# Patient Record
Sex: Male | Born: 1950 | Race: White | Hispanic: No | Marital: Single | Smoking: Never smoker
Health system: Southern US, Community
[De-identification: ages and names within clinical notes are randomized; demographics above are authoritative.]

## PROBLEM LIST (undated history)

## (undated) DIAGNOSIS — I2510 Atherosclerotic heart disease of native coronary artery without angina pectoris: Secondary | ICD-10-CM

## (undated) DIAGNOSIS — E785 Hyperlipidemia, unspecified: Secondary | ICD-10-CM

## (undated) DIAGNOSIS — I10 Essential (primary) hypertension: Secondary | ICD-10-CM

## (undated) HISTORY — DX: Hyperlipidemia, unspecified: E78.5

## (undated) HISTORY — DX: Atherosclerotic heart disease of native coronary artery without angina pectoris: I25.10

## (undated) HISTORY — DX: Essential (primary) hypertension: I10

## (undated) HISTORY — PX: CORONARY STENT PLACEMENT: SHX1402

---

## 2020-02-27 DIAGNOSIS — H2513 Age-related nuclear cataract, bilateral: Secondary | ICD-10-CM | POA: Diagnosis not present

## 2020-02-27 DIAGNOSIS — H40033 Anatomical narrow angle, bilateral: Secondary | ICD-10-CM | POA: Diagnosis not present

## 2020-03-12 DIAGNOSIS — E7849 Other hyperlipidemia: Secondary | ICD-10-CM | POA: Diagnosis not present

## 2020-03-12 DIAGNOSIS — I10 Essential (primary) hypertension: Secondary | ICD-10-CM | POA: Diagnosis not present

## 2020-03-12 DIAGNOSIS — R079 Chest pain, unspecified: Secondary | ICD-10-CM | POA: Diagnosis not present

## 2020-03-12 DIAGNOSIS — I25118 Atherosclerotic heart disease of native coronary artery with other forms of angina pectoris: Secondary | ICD-10-CM | POA: Diagnosis not present
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
## 2020-03-30 ENCOUNTER — Other Ambulatory Visit: Payer: Self-pay

## 2020-03-30 ENCOUNTER — Encounter: Payer: Self-pay | Admitting: Cardiology

## 2020-03-30 ENCOUNTER — Ambulatory Visit: Payer: Medicare Other | Admitting: Cardiology

## 2020-03-30 VITALS — BP 149/87 | HR 69 | Ht 67.0 in | Wt 217.0 lb

## 2020-03-30 DIAGNOSIS — I2510 Atherosclerotic heart disease of native coronary artery without angina pectoris: Secondary | ICD-10-CM

## 2020-03-30 DIAGNOSIS — E782 Mixed hyperlipidemia: Secondary | ICD-10-CM | POA: Diagnosis not present

## 2020-03-30 DIAGNOSIS — I10 Essential (primary) hypertension: Secondary | ICD-10-CM | POA: Diagnosis not present

## 2020-03-30 MED ORDER — ASPIRIN EC 81 MG PO TBEC: 81.0000 mg | DELAYED_RELEASE_TABLET | Freq: Every day | ORAL | 3 refills | Status: DC

## 2020-03-30 MED ORDER — ATORVASTATIN CALCIUM 40 MG PO TABS: 40.0000 mg | ORAL_TABLET | Freq: Every day | ORAL | 2 refills | Status: DC

## 2020-03-30 MED ORDER — LISINOPRIL 10 MG PO TABS: 10.0000 mg | ORAL_TABLET | Freq: Every day | ORAL | 2 refills | Status: DC

## 2020-04-02 DIAGNOSIS — I25118 Atherosclerotic heart disease of native coronary artery with other forms of angina pectoris: Secondary | ICD-10-CM | POA: Diagnosis not present

## 2020-04-02 DIAGNOSIS — I10 Essential (primary) hypertension: Secondary | ICD-10-CM | POA: Diagnosis not present

## 2020-04-02 DIAGNOSIS — R972 Elevated prostate specific antigen [PSA]: Secondary | ICD-10-CM | POA: Diagnosis not present

## 2020-04-02 DIAGNOSIS — Z959 Presence of cardiac and vascular implant and graft, unspecified: Secondary | ICD-10-CM | POA: Diagnosis not present

## 2020-04-04 ENCOUNTER — Ambulatory Visit: Payer: Medicare Other

## 2020-04-04 ENCOUNTER — Other Ambulatory Visit: Payer: Self-pay

## 2020-04-04 DIAGNOSIS — I2510 Atherosclerotic heart disease of native coronary artery without angina pectoris: Secondary | ICD-10-CM | POA: Diagnosis not present

## 2020-04-06 ENCOUNTER — Other Ambulatory Visit (HOSPITAL_COMMUNITY): Payer: Self-pay | Admitting: Cardiology

## 2020-04-06 DIAGNOSIS — I2510 Atherosclerotic heart disease of native coronary artery without angina pectoris: Secondary | ICD-10-CM | POA: Diagnosis not present

## 2020-04-07 LAB — BASIC METABOLIC PANEL
BUN/Creatinine Ratio: 13 (ref 10–24)
BUN: 12 mg/dL (ref 8–27)
CO2: 23 mmol/L (ref 20–29)
Calcium: 9.4 mg/dL (ref 8.6–10.2)
Chloride: 103 mmol/L (ref 96–106)
Creatinine, Ser: 0.91 mg/dL (ref 0.76–1.27)
GFR calc Af Amer: 100 mL/min/{1.73_m2} (ref >59)
GFR calc non Af Amer: 86 mL/min/{1.73_m2} (ref >59)
Glucose: 114 mg/dL — ABNORMAL HIGH (ref 65–99)
Potassium: 4.8 mmol/L (ref 3.5–5.2)
Sodium: 140 mmol/L (ref 134–144)

## 2020-04-07 LAB — LIPID PANEL W/O CHOL/HDL RATIO
Cholesterol, Total: 144 mg/dL (ref 100–199)
HDL: 36 mg/dL — ABNORMAL LOW (ref >39)
LDL Chol Calc (NIH): 94 mg/dL (ref 0–99)
Triglycerides: 72 mg/dL (ref 0–149)
VLDL Cholesterol Cal: 14 mg/dL (ref 5–40)

## 2020-04-16 ENCOUNTER — Ambulatory Visit: Payer: Medicare Other

## 2020-04-16 ENCOUNTER — Other Ambulatory Visit: Payer: Self-pay

## 2020-04-16 DIAGNOSIS — I2510 Atherosclerotic heart disease of native coronary artery without angina pectoris: Secondary | ICD-10-CM | POA: Diagnosis not present

## 2020-05-11 ENCOUNTER — Ambulatory Visit: Payer: Medicare Other | Admitting: Cardiology

## 2020-05-11 ENCOUNTER — Telehealth: Payer: Self-pay

## 2020-05-11 NOTE — Telephone Encounter (Signed)
Ok. Thank you for the follow up. Did he cancel today's appt?

## 2020-05-11 NOTE — Telephone Encounter (Signed)
He cx but did not rs. He stated this is a result visit and he didn't feel the need of coming in for it. Asked for a call back from provider. Rasheeda spoke to one of the MA's and they said yes.
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
## 2020-05-14 NOTE — Telephone Encounter (Signed)
Discussed results over the phone. Patient will call back if he would like to make a f/u appt  Thanks MJP

## 2020-06-27 ENCOUNTER — Other Ambulatory Visit: Payer: Self-pay

## 2020-06-27 DIAGNOSIS — I2510 Atherosclerotic heart disease of native coronary artery without angina pectoris: Secondary | ICD-10-CM

## 2020-06-27 MED ORDER — ATORVASTATIN CALCIUM 40 MG PO TABS: 40.0000 mg | ORAL_TABLET | Freq: Every day | ORAL | 3 refills | Status: DC

## 2020-06-27 MED ORDER — LISINOPRIL 10 MG PO TABS: 10.0000 mg | ORAL_TABLET | Freq: Every day | ORAL | 2 refills | Status: DC

## 2021-01-02 ENCOUNTER — Emergency Department (HOSPITAL_COMMUNITY): Payer: Medicare Other

## 2021-01-02 ENCOUNTER — Emergency Department (HOSPITAL_COMMUNITY)
Admission: EM | Admit: 2021-01-02 | Discharge: 2021-01-03 | Disposition: A | Discharge status: Home / Self Care | Payer: Medicare Other | Attending: Emergency Medicine | Admitting: Emergency Medicine

## 2021-01-02 ENCOUNTER — Other Ambulatory Visit: Payer: Self-pay

## 2021-01-02 ENCOUNTER — Encounter (HOSPITAL_COMMUNITY): Payer: Self-pay | Admitting: *Deleted

## 2021-01-02 DIAGNOSIS — I2510 Atherosclerotic heart disease of native coronary artery without angina pectoris: Secondary | ICD-10-CM | POA: Diagnosis not present

## 2021-01-02 DIAGNOSIS — I10 Essential (primary) hypertension: Secondary | ICD-10-CM | POA: Diagnosis not present

## 2021-01-02 DIAGNOSIS — R112 Nausea with vomiting, unspecified: Secondary | ICD-10-CM | POA: Diagnosis not present

## 2021-01-02 DIAGNOSIS — R231 Pallor: Secondary | ICD-10-CM | POA: Diagnosis not present

## 2021-01-02 DIAGNOSIS — Z955 Presence of coronary angioplasty implant and graft: Secondary | ICD-10-CM | POA: Diagnosis not present

## 2021-01-02 DIAGNOSIS — R0902 Hypoxemia: Secondary | ICD-10-CM | POA: Diagnosis not present

## 2021-01-02 DIAGNOSIS — Z79899 Other long term (current) drug therapy: Secondary | ICD-10-CM | POA: Diagnosis not present

## 2021-01-02 DIAGNOSIS — Z7982 Long term (current) use of aspirin: Secondary | ICD-10-CM | POA: Diagnosis not present

## 2021-01-02 DIAGNOSIS — R42 Dizziness and giddiness: Secondary | ICD-10-CM | POA: Diagnosis not present

## 2021-01-02 DIAGNOSIS — R1111 Vomiting without nausea: Secondary | ICD-10-CM | POA: Diagnosis not present

## 2021-01-02 LAB — COMPREHENSIVE METABOLIC PANEL
ALT: 13 U/L (ref 0–44)
AST: 24 U/L (ref 15–41)
Albumin: 3.9 g/dL (ref 3.5–5.0)
Alkaline Phosphatase: 66 U/L (ref 38–126)
Anion gap: 11 (ref 5–15)
BUN: 12 mg/dL (ref 8–23)
CO2: 21 mmol/L — ABNORMAL LOW (ref 22–32)
Calcium: 8.9 mg/dL (ref 8.9–10.3)
Chloride: 105 mmol/L (ref 98–111)
Creatinine, Ser: 0.88 mg/dL (ref 0.61–1.24)
GFR, Estimated: >60 mL/min (ref >60)
Glucose, Bld: 166 mg/dL — ABNORMAL HIGH (ref 70–99)
Potassium: 3.7 mmol/L (ref 3.5–5.1)
Sodium: 137 mmol/L (ref 135–145)
Total Bilirubin: 0.7 mg/dL (ref 0.3–1.2)
Total Protein: 7.1 g/dL (ref 6.5–8.1)

## 2021-01-02 LAB — CBC WITH DIFFERENTIAL/PLATELET
Abs Immature Granulocytes: 0.13 10*3/uL — ABNORMAL HIGH (ref 0.00–0.07)
Basophils Absolute: 0.1 10*3/uL (ref 0.0–0.1)
Basophils Relative: 0 %
Eosinophils Absolute: 0 10*3/uL (ref 0.0–0.5)
Eosinophils Relative: 0 %
HCT: 43.8 % (ref 39.0–52.0)
Hemoglobin: 14.8 g/dL (ref 13.0–17.0)
Immature Granulocytes: 1 %
Lymphocytes Relative: 7 %
Lymphs Abs: 1 10*3/uL (ref 0.7–4.0)
MCH: 30.8 pg (ref 26.0–34.0)
MCHC: 33.8 g/dL (ref 30.0–36.0)
MCV: 91.1 fL (ref 80.0–100.0)
Monocytes Absolute: 1.1 10*3/uL — ABNORMAL HIGH (ref 0.1–1.0)
Monocytes Relative: 8 %
Neutro Abs: 12.1 10*3/uL — ABNORMAL HIGH (ref 1.7–7.7)
Neutrophils Relative %: 84 %
Platelets: 217 10*3/uL (ref 150–400)
RBC: 4.81 MIL/uL (ref 4.22–5.81)
RDW: 12.6 % (ref 11.5–15.5)
WBC: 14.4 10*3/uL — ABNORMAL HIGH (ref 4.0–10.5)
nRBC: 0 % (ref 0.0–0.2)

## 2021-01-02 LAB — TROPONIN I (HIGH SENSITIVITY)
Troponin I (High Sensitivity): 10 ng/L (ref <18)
Troponin I (High Sensitivity): 4 ng/L (ref <18)

## 2021-01-02 LAB — CBG MONITORING, ED: Glucose-Capillary: 165 mg/dL — ABNORMAL HIGH (ref 70–99)

## 2021-01-02 MED ORDER — MECLIZINE HCL 25 MG PO TABS
25.0000 mg | ORAL_TABLET | Freq: Once | ORAL | Status: AC
  Administered 2021-01-02: 25 mg via ORAL
  Filled 2021-01-02: qty 1

## 2021-01-02 MED ORDER — ONDANSETRON HCL 4 MG/2ML IJ SOLN
4.0000 mg | Freq: Once | INTRAMUSCULAR | Status: AC
  Administered 2021-01-02: 4 mg via INTRAVENOUS
  Filled 2021-01-02: qty 2

## 2021-01-02 NOTE — ED Notes (Signed)
cbg by ems [id number]

## 2021-01-02 NOTE — ED Notes (Signed)
To mri 

## 2021-01-02 NOTE — ED Provider Notes (Signed)
[facility] EMERGENCY DEPARTMENT Provider Note   CSN: [id number] Arrival date & time: 01/02/21  2022     History Chief Complaint  Patient presents with  . Dizziness    Rickey [name] is a 70 y.o. male.  The history is provided by the patient, the EMS personnel and medical records.   Rickey [name] is a 70 y.o. male who presents to the Emergency Department complaining of dizziness and vomiting. He presents the emergency department by EMS for evaluation of dizziness and vomiting. He was last known at his baseline around 3 PM today. He states that the symptoms started several hours ago and have worsened since they started. He states that the room is spinning and he is unable to walk. He has significant nausea, vomiting and feels clammy. He denies any chest pain, headache, shortness of breath, numbness. He does have generalized weakness. He has a history of hypertension, hyperlipidemia, coronary artery disease. He states he had similar symptoms in the past but had chest pain at that time and had a heart attack. He states that today's symptoms are different because he has no associated chest pain.    Past Medical History:  Diagnosis Date  . CAD (coronary artery disease)   . Hyperlipidemia   . Hypertension     Patient Active Problem List   Diagnosis Date Noted  . Mixed hyperlipidemia 03/30/2020  . Essential hypertension 03/30/2020  . Coronary artery disease involving native coronary artery of native heart without angina pectoris 03/30/2020    Past Surgical History:  Procedure Laterality Date  . CORONARY STENT PLACEMENT     2012       Family History  Problem Relation Age of Onset  . Diabetes Mother   . Cancer Father   . Stroke Father     Social History   Tobacco Use  . Smoking status: Never Smoker  . Smokeless tobacco: Never Used  Vaping Use  . Vaping Use: Never used  Substance Use Topics  . Alcohol use: Yes    Comment: occ  . Drug use: Yes     Types: Marijuana    Home Medications Prior to Admission medications   Medication Sig Start Date End Date Taking? Authorizing Provider  aspirin EC 81 MG tablet Take 1 tablet (81 mg total) by mouth daily. Swallow whole. 03/30/20   Patwardhan, Anabel Bene, MD  atorvastatin (LIPITOR) 40 MG tablet Take 1 tablet (40 mg total) by mouth daily. 06/27/20   Patwardhan, Anabel Bene, MD  lisinopril (ZESTRIL) 10 MG tablet Take 1 tablet (10 mg total) by mouth daily. 06/27/20   Patwardhan, Anabel Bene, MD    Allergies    Penicillins  Review of Systems   Review of Systems  All other systems reviewed and are negative.   Physical Exam Updated Vital Signs BP 134/75   Pulse 62   Resp 11   Ht 5\' 7"  (1.702 m)   Wt 99.8 kg   SpO2 94%   BMI 34.46 kg/m   Physical Exam Vitals and nursing note reviewed.  Constitutional:      Appearance: He is well-developed.  HENT:     Head: Normocephalic and atraumatic.     Mouth/Throat:     Mouth: Mucous membranes are moist.  Eyes:     Pupils: Pupils are equal, round, and reactive to light.  Cardiovascular:     Rate and Rhythm: Normal rate and regular rhythm.     Heart sounds: No murmur heard.  Pulmonary:     Effort: Pulmonary effort is normal. No respiratory distress.     Breath sounds: Normal breath sounds.  Abdominal:     Palpations: Abdomen is soft.     Tenderness: There is no abdominal tenderness. There is no guarding or rebound.  Musculoskeletal:        General: No tenderness.  Skin:    General: Skin is warm and dry.  Neurological:     Mental Status: He is alert and oriented to person, place, and time.     Comments: Mild generalized weakness. There is horizontal nystagmus bilaterally. No ataxia on finger to nose bilaterally. Visual fields grossly intact.  Psychiatric:        Behavior: Behavior normal.     ED Results / Procedures / Treatments   Labs (all labs ordered are listed, but only abnormal results are displayed) Labs Reviewed  COMPREHENSIVE  METABOLIC PANEL - Abnormal; Notable for the following components:      Result Value   CO2 21 (*)    Glucose, Bld 166 (*)    All other components within normal limits  CBC WITH DIFFERENTIAL/PLATELET - Abnormal; Notable for the following components:   WBC 14.4 (*)    Neutro Abs 12.1 (*)    Monocytes Absolute 1.1 (*)    Abs Immature Granulocytes 0.13 (*)    All other components within normal limits  CBG MONITORING, ED - Abnormal; Notable for the following components:   Glucose-Capillary 165 (*)    All other components within normal limits  URINALYSIS, ROUTINE W REFLEX MICROSCOPIC  TROPONIN I (HIGH SENSITIVITY)  TROPONIN I (HIGH SENSITIVITY)    EKG EKG Interpretation  Date/Time:  Wednesday January 02 2021 20:22:42 EDT Ventricular Rate:  65 PR Interval:  149 QRS Duration: 104 QT Interval:  434 QTC Calculation: 452 R Axis:   26 Text Interpretation: Sinus rhythm Consider RVH or posterior infarct Probable inferior infarct, old Confirmed by Tilden Fossa [id number]) on 01/02/2021 8:28:09 PM   Radiology CT Head Wo Contrast  Result Date: 01/02/2021 CLINICAL DATA:  Vomiting and dizziness EXAM: CT HEAD WITHOUT CONTRAST TECHNIQUE: Contiguous axial images were obtained from the base of the skull through the vertex without intravenous contrast. COMPARISON:  None. FINDINGS: Brain: No evidence of acute territorial infarction, hemorrhage, hydrocephalus,extra-axial collection or mass lesion/mass effect. There is dilatation the ventricles and sulci consistent with age-related atrophy. Low-attenuation changes in the deep white matter consistent with small vessel ischemia. Vascular: No hyperdense vessel or unexpected calcification. Skull: The skull is intact. No fracture or focal lesion identified. Sinuses/Orbits: The visualized paranasal sinuses and mastoid air cells are clear. The orbits and globes intact. Other: None IMPRESSION: No acute intracranial abnormality. Findings consistent with age related  atrophy and chronic small vessel ischemia Electronically Signed   By: Jonna Clark M.D.   On: 01/02/2021 22:05    Procedures Procedures   Medications Ordered in ED Medications  meclizine (ANTIVERT) tablet 25 mg (25 mg Oral Given 01/02/21 2137)  ondansetron (ZOFRAN) injection 4 mg (4 mg Intravenous Given 01/02/21 2137)    ED Course  I have reviewed the triage vital signs and the nursing notes.  Pertinent labs & imaging results that were available during my care of the patient were reviewed by me and considered in my medical decision making (see chart for details).    MDM Rules/Calculators/A&P                         patient  with history of coronary artery disease, hypertension, hyperlipidemia here for evaluation of vertigo. Last known well at 3 PM. Patient with horizontal nystagmus bilaterally on examination, unable to sit up in bed due to severe vertigo. Symptoms do not extinguish. He has no additional focal neurologic deficits. CT scan is negative for acute CVA. Plan to obtain MRI to further evaluate. On repeat assessment following meclizine administration patient symptoms are partially improved. Patient care transferred pending MRI.  Final Clinical Impression(s) / ED Diagnoses Final diagnoses:  None    Rx / DC Orders ED Discharge Orders    None       Tilden Fossa, MD 01/03/21 [phone]

## 2021-01-02 NOTE — ED Triage Notes (Signed)
The pt arrived by gems from home  He has been vomiting for 6 hours with dizziness and weakness  He is not a diabetic.   He has had rthis same dizziness in the past  No pain

## 2021-01-02 NOTE — ED Notes (Signed)
The pt reports that he is not as dizzy as he was

## 2021-01-03 DIAGNOSIS — R42 Dizziness and giddiness: Secondary | ICD-10-CM | POA: Diagnosis not present

## 2021-01-03 MED ORDER — MECLIZINE HCL 25 MG PO TABS: 25.0000 mg | ORAL_TABLET | Freq: Three times a day (TID) | ORAL | 0 refills | Status: DC | PRN

## 2021-01-03 NOTE — ED Notes (Signed)
The pt returned from mri alert no distress

## 2021-01-03 NOTE — ED Provider Notes (Signed)
1:12 AM Assumed care from Dr. Madilyn Hook, please see their note for full history, physical and decision making until this point. In brief this is a 70 y.o. year old male who presented to the ED tonight with Dizziness     Here with vertigo and nystagmus. Vertigo improved, nystagmus remains. Pending MRI and reeval.   Apparently patient walked pretty well and close to baseline. On my review, MRI looks ok (cerebellar region), however it is still pending rads review.   Radiology review unremarkable. Patient basically asymptomatic at this time (mild vertigo, but barely noticeable per patient report). Will plan for dc w/ pcp follow up and mecliziner PRN.   Discharge instructions, including strict return precautions for new or worsening symptoms, given. Patient and/or family verbalized understanding and agreement with the plan as described.   Labs, studies and imaging reviewed by myself and considered in medical decision making if ordered. Imaging interpreted by radiology.  Labs Reviewed  COMPREHENSIVE METABOLIC PANEL - Abnormal; Notable for the following components:      Result Value   CO2 21 (*)    Glucose, Bld 166 (*)    All other components within normal limits  CBC WITH DIFFERENTIAL/PLATELET - Abnormal; Notable for the following components:   WBC 14.4 (*)    Neutro Abs 12.1 (*)    Monocytes Absolute 1.1 (*)    Abs Immature Granulocytes 0.13 (*)    All other components within normal limits  CBG MONITORING, ED - Abnormal; Notable for the following components:   Glucose-Capillary 165 (*)    All other components within normal limits  URINALYSIS, ROUTINE W REFLEX MICROSCOPIC  TROPONIN I (HIGH SENSITIVITY)  TROPONIN I (HIGH SENSITIVITY)    CT Head Wo Contrast  Final Result    MR BRAIN WO CONTRAST    (Results Pending)    No follow-ups on file.    Marily Memos, MD 01/03/21 [phone]

## 2021-01-03 NOTE — ED Notes (Signed)
The pt walked the length of the hallway only sl dizziness no nausea

## 2021-08-31 ENCOUNTER — Other Ambulatory Visit: Payer: Self-pay | Admitting: Cardiology

## 2021-08-31 DIAGNOSIS — I2510 Atherosclerotic heart disease of native coronary artery without angina pectoris: Secondary | ICD-10-CM

## 2022-01-18 ENCOUNTER — Other Ambulatory Visit: Payer: Self-pay | Admitting: Cardiology

## 2022-01-18 DIAGNOSIS — I2510 Atherosclerotic heart disease of native coronary artery without angina pectoris: Secondary | ICD-10-CM

## 2022-03-16 ENCOUNTER — Encounter: Payer: Self-pay | Admitting: Emergency Medicine

## 2022-03-16 ENCOUNTER — Ambulatory Visit (INDEPENDENT_AMBULATORY_CARE_PROVIDER_SITE_OTHER): Payer: Medicare Other

## 2022-03-16 ENCOUNTER — Ambulatory Visit
Admission: EM | Admit: 2022-03-16 | Discharge: 2022-03-16 | Disposition: A | Discharge status: Home / Self Care | Payer: Medicare Other | Attending: Emergency Medicine | Admitting: Emergency Medicine

## 2022-03-16 ENCOUNTER — Other Ambulatory Visit: Payer: Self-pay

## 2022-03-16 DIAGNOSIS — K5900 Constipation, unspecified: Secondary | ICD-10-CM | POA: Diagnosis not present

## 2022-03-16 MED ORDER — LINACLOTIDE 145 MCG PO CAPS: 145.0000 ug | ORAL_CAPSULE | Freq: Every day | ORAL | 0 refills | Status: DC

## 2022-03-16 MED ORDER — POLYETHYLENE GLYCOL 3350 17 GM/SCOOP PO POWD: 1.0000 | Freq: Once | ORAL | 0 refills | Status: AC

## 2022-03-16 NOTE — ED Provider Notes (Signed)
[facility]    CSN: [id number] Arrival date & time: 03/16/22  1147    HISTORY   Chief Complaint  Patient presents with   Constipation   HPI Rickey [name] is a 71 y.o. male. Patient presents to urgent care today complaining of significant constipation only having had 1 small bowel movement since May 31 which was 11 days ago.  Patient states his last known meaningful bowel movement was on May 30 before he left the an 11[place] vacation [place] by private vehicle.  Patient states somewhere along the way, does not recall which day, he stopped at a truck stop and bought Ex-Lax which produced a small amount of liquid stool.  Patient states that since then he has only had small amounts of liquid stool with small chunks of solid material and very few of these movements at all.  Patient denies abdominal pain, patient endorses passing flatus.  Patient appears to be in no acute distress at this time.  Patient denies history of chronic constipation or IBS.  The history is provided by the patient.   Past Medical History:  Diagnosis Date   CAD (coronary artery disease)    Hyperlipidemia    Hypertension    Patient Active Problem List   Diagnosis Date Noted   Mixed hyperlipidemia 03/30/2020   Essential hypertension 03/30/2020   Coronary artery disease involving native coronary artery of native heart without angina pectoris 03/30/2020   Past Surgical History:  Procedure Laterality Date   CORONARY STENT PLACEMENT     2012    Home Medications    Prior to Admission medications   Medication Sig Start Date End Date Taking? Authorizing Provider  aspirin EC 81 MG tablet Take 1 tablet (81 mg total) by mouth daily. Swallow whole. 03/30/20   Patwardhan, Manish J, MD  atorvastatin (LIPITOR) 40 MG tablet TAKE 1 TABLET BY MOUTH ONCE DAILY IN THE EVENING . APPOINTMENT REQUIRED FOR FUTURE REFILLS 01/20/22   Patwardhan, Anabel BeneManish J, MD  lisinopril (ZESTRIL) 10 MG tablet TAKE 1 TABLET BY MOUTH  ONCE DAILY . APPOINTMENT REQUIRED FOR FUTURE REFILLS 01/20/22   Patwardhan, Anabel BeneManish J, MD  meclizine (ANTIVERT) 25 MG tablet Take 1 tablet (25 mg total) by mouth 3 (three) times daily as needed for dizziness. 01/03/21   Mesner, Barbara CowerJason, MD  vitamin C (ASCORBIC ACID) 500 MG tablet Take 500 mg by mouth daily.    [provider]    Family History Family History  Problem Relation Age of Onset   Diabetes Mother    Cancer Father    Stroke Father    Social History Social History   Tobacco Use   Smoking status: Never   Smokeless tobacco: Never  Vaping Use   Vaping Use: Never used  Substance Use Topics   Alcohol use: Yes    Comment: occ   Drug use: Yes    Types: Marijuana   Allergies   Penicillins  Review of Systems Review of Systems Pertinent findings noted in history of present illness.   Physical Exam Triage Vital Signs ED Triage Vitals  Enc Vitals Group     BP 08/02/21 0827 (!) 147/82     Pulse Rate 08/02/21 0827 72     Resp 08/02/21 0827 18     Temp 08/02/21 0827 98.3 F (36.8 C)     Temp Source 08/02/21 0827 Oral     SpO2 08/02/21 0827 98 %     Weight --      Height --  Head Circumference --      Peak Flow --      Pain Score 08/02/21 0826 5     Pain Loc --      Pain Edu? --      Excl. in GC? --   No data found.  Updated Vital Signs BP (!) 155/95 (BP Location: Left Arm)   Pulse 84   Temp 98.1 F (36.7 C) (Oral)   Resp 18   SpO2 95%   Physical Exam Vitals and nursing note reviewed.  Constitutional:      General: He is not in acute distress.    Appearance: Normal appearance. He is not ill-appearing.  HENT:     Head: Normocephalic and atraumatic.  Eyes:     General: Lids are normal.        Right eye: No discharge.        Left eye: No discharge.     Extraocular Movements: Extraocular movements intact.     Conjunctiva/sclera: Conjunctivae normal.     Right eye: Right conjunctiva is not injected.     Left eye: Left conjunctiva is not injected.   Neck:     Trachea: Trachea and phonation normal.  Cardiovascular:     Rate and Rhythm: Normal rate and regular rhythm.     Pulses: Normal pulses.     Heart sounds: Normal heart sounds. No murmur heard.    No friction rub. No gallop.  Pulmonary:     Effort: Pulmonary effort is normal. No accessory muscle usage, prolonged expiration or respiratory distress.     Breath sounds: Normal breath sounds. No stridor, decreased air movement or transmitted upper airway sounds. No decreased breath sounds, wheezing, rhonchi or rales.  Chest:     Chest wall: No tenderness.  Abdominal:     General: Abdomen is flat. Bowel sounds are normal. There is distension.     Palpations: Abdomen is soft. There is no mass.     Tenderness: There is no abdominal tenderness. There is no right CVA tenderness, left CVA tenderness, guarding or rebound.     Hernia: No hernia is present.  Musculoskeletal:        General: Normal range of motion.     Cervical back: Normal range of motion and neck supple. Normal range of motion.  Lymphadenopathy:     Cervical: No cervical adenopathy.  Skin:    General: Skin is warm and dry.     Findings: No erythema or rash.  Neurological:     General: No focal deficit present.     Mental Status: He is alert and oriented to person, place, and time.  Psychiatric:        Mood and Affect: Mood normal.        Behavior: Behavior normal.     Visual Acuity Right Eye Distance:   Left Eye Distance:   Bilateral Distance:    Right Eye Near:   Left Eye Near:    Bilateral Near:     [facility] Couse / Diagnostics / Procedures:    EKG  Radiology DG Abdomen 1 View  Result Date: 03/16/2022 CLINICAL DATA:  Constipation EXAM: ABDOMEN - 1 VIEW COMPARISON:  None Available. FINDINGS: No free air.  Lung bases clear. Stomach and small bowel decompressed. Moderate colonic and rectal fecal material without dilatation. Multilevel spondylitic changes in the lower thoracic and lumbar spine. IMPRESSION:  Nonobstructive bowel gas pattern with moderate colonic fecal material. Electronically Signed   By: Ronald Pippins.D.  On: 03/16/2022 12:44    Procedures Procedures (including critical care time)  UC Diagnoses / Final Clinical Impressions(s)   I have reviewed the triage vital signs and the nursing notes.  Pertinent labs & imaging results that were available during my care of the patient were reviewed by me and considered in my medical decision making (see chart for details).    Final diagnoses:  Acute constipation   Because this is a single episode I advised patient that he would benefit from a few doses of Linzess if his cost is not too high or alternately he could perform a bowel cleanout similar to that performed when once having colonoscopy.  Patient was agreeable to this plan.  Return precautions advised.  ED Prescriptions     Medication Sig Dispense Auth. Provider   linaclotide (LINZESS) 145 MCG CAPS capsule Take 1 capsule (145 mcg total) by mouth daily before breakfast for 3 days. 3 capsule Theadora Rama Scales, PA-C   polyethylene glycol powder (GLYCOLAX/MIRALAX) 17 GM/SCOOP powder Take 255 g by mouth once for 1 dose. Mix into 64 ounces of Gatorade 255 g Theadora Rama Scales, PA-C      PDMP not reviewed this encounter.  Pending results:  Labs Reviewed - No data to display  Medications Ordered in UC: Medications - No data to display  Disposition Upon Discharge:  Condition: stable for discharge home Home: take medications as prescribed; routine discharge instructions as discussed; follow up as advised.  Patient presented with an acute illness with associated systemic symptoms and significant discomfort requiring urgent management. In my opinion, this is a condition that a prudent lay person (someone who possesses an average knowledge of health and medicine) may potentially expect to result in complications if not addressed urgently such as respiratory distress, impairment  of bodily function or dysfunction of bodily organs.   Routine symptom specific, illness specific and/or disease specific instructions were discussed with the patient and/or caregiver at length.   As such, the patient has been evaluated and assessed, work-up was performed and treatment was provided in alignment with urgent care protocols and evidence based medicine.  Patient/parent/caregiver has been advised that the patient may require follow up for further testing and treatment if the symptoms continue in spite of treatment, as clinically indicated and appropriate.  Patient/parent/caregiver has been advised to return to the [facility] or PCP if no better; to PCP or the Emergency Department if new signs and symptoms develop, or if the current signs or symptoms continue to change or worsen for further workup, evaluation and treatment as clinically indicated and appropriate  The patient will follow up with their current PCP if and as advised. If the patient does not currently have a PCP we will assist them in obtaining one.   The patient may need specialty follow up if the symptoms continue, in spite of conservative treatment and management, for further workup, evaluation, consultation and treatment as clinically indicated and appropriate.   Patient/parent/caregiver verbalized understanding and agreement of plan as discussed.  All questions were addressed during visit.  Please see discharge instructions below for further details of plan.  Discharge Instructions:   Discharge Instructions      For acute treatment of constipation, please either pick up prescription for 3 capsules of Linzess or MiraLAX.  According to our system, the test is likely not covered by your insurance and while I have provided you with a coupon for 3 capsules, the price will still be $60 at [facility].  If this  is too expensive for you, which it would be for me, the second option is to buy MiraLAX 255 g bottle of powder and mix it  into 40 to 60 ounces of Gatorade and consume the mixture within a 2-hour.  The end result be the same, though be a rapid shift of fluid from the outside of your colon to the inside of your colon and the volume will cause you to have a significant bowel movement and hopefully completely relieve your constipation.  If you begin to have pain with either of these treatments, please go to the emergency room as soon as possible for evaluation of intestinal blockage.  Based on your x-ray findings, which did show a moderate burden of stool in your colon, I do not believe that this is the case but I do want to advise you of that possibility so that you will know what to do if this happens.  Enck you for visiting urgent care today.      This office note has been dictated using Teaching laboratory technician.  Unfortunately, and despite my best efforts, this method of dictation can sometimes lead to occasional typographical or grammatical errors.  I apologize in advance if this occurs.     Theadora Rama Scales, PA-C 03/17/22 0830

## 2022-03-16 NOTE — ED Triage Notes (Signed)
Pt here for constipation and only small BM since 5/31

## 2022-03-16 NOTE — Discharge Instructions (Signed)
For acute treatment of constipation, please either pick up prescription for 3 capsules of Linzess or MiraLAX.  According to our system, the test is likely not covered by your insurance and while I have provided you with a coupon for 3 capsules, the price will still be $60 at [facility].  If this is too expensive for you, which it would be for me, the second option is to buy MiraLAX 255 g bottle of powder and mix it into 40 to 60 ounces of Gatorade and consume the mixture within a 2-hour.  The end result be the same, though be a rapid shift of fluid from the outside of your colon to the inside of your colon and the volume will cause you to have a significant bowel movement and hopefully completely relieve your constipation.  If you begin to have pain with either of these treatments, please go to the emergency room as soon as possible for evaluation of intestinal blockage.  Based on your x-ray findings, which did show a moderate burden of stool in your colon, I do not believe that this is the case but I do want to advise you of that possibility so that you will know what to do if this happens.  Enck you for visiting urgent care today.

## 2022-04-29 ENCOUNTER — Other Ambulatory Visit: Payer: Self-pay | Admitting: Family Medicine

## 2022-04-29 ENCOUNTER — Ambulatory Visit
Admission: RE | Admit: 2022-04-29 | Discharge: 2022-04-29 | Disposition: A | Discharge status: Home / Self Care | Payer: Medicare Other | Source: Ambulatory Visit | Attending: Family Medicine | Admitting: Family Medicine

## 2022-04-29 DIAGNOSIS — M25562 Pain in left knee: Secondary | ICD-10-CM

## 2022-05-02 IMAGING — CT CT HEAD W/O CM
4 series · 16 of 47 positions shown, 18 images · non-contrast
Comparison: None.

CLINICAL DATA: Vomiting and dizziness

EXAM:
CT HEAD WITHOUT CONTRAST
TECHNIQUE: Contiguous axial images were obtained from the base of the skull
through the vertex without intravenous contrast.

[Series 3: head without · axial · non-contrast · 0.45mm/px · z∈[+565,+685]mm · 7 of 32 slices shown, 9 images]
[im 4/32  brain]
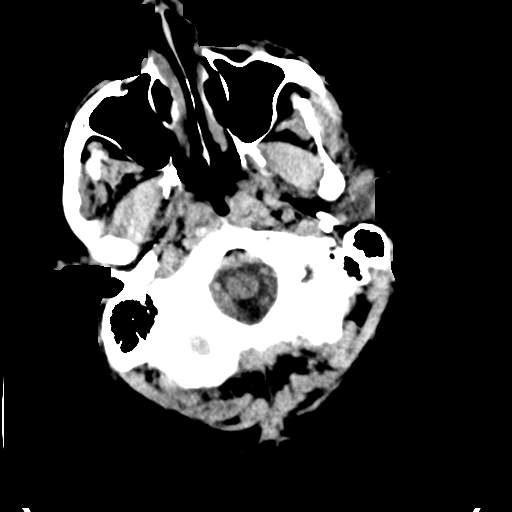
[im 4/32  bone]
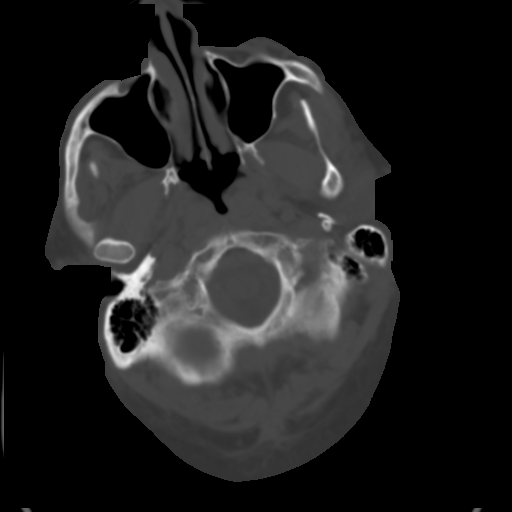
[im 8/32  brain]
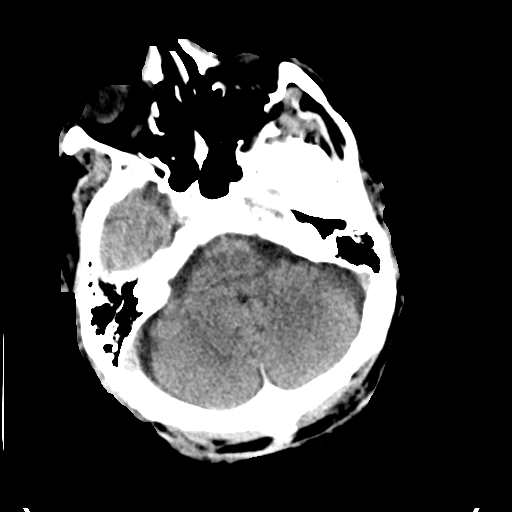
[im 12/32  brain]
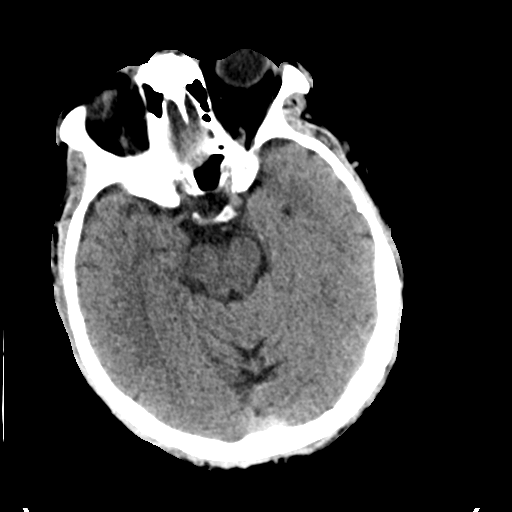
[im 16/32  brain]
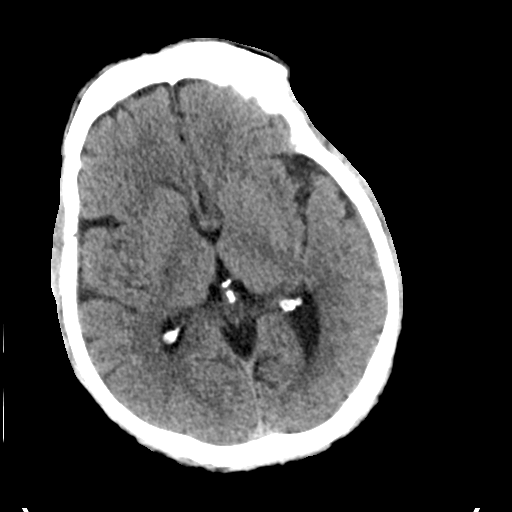
[im 20/32  brain]
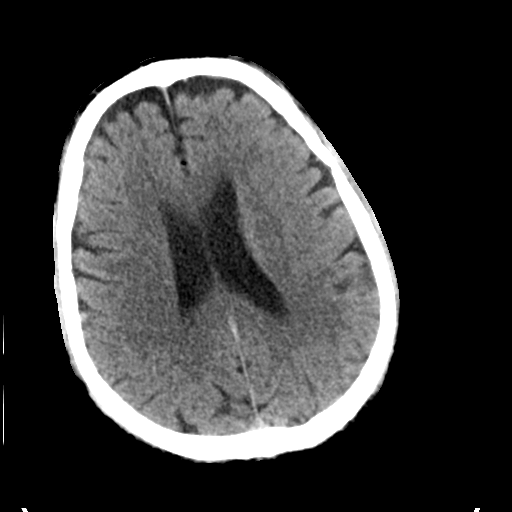
[im 20/32  bone]
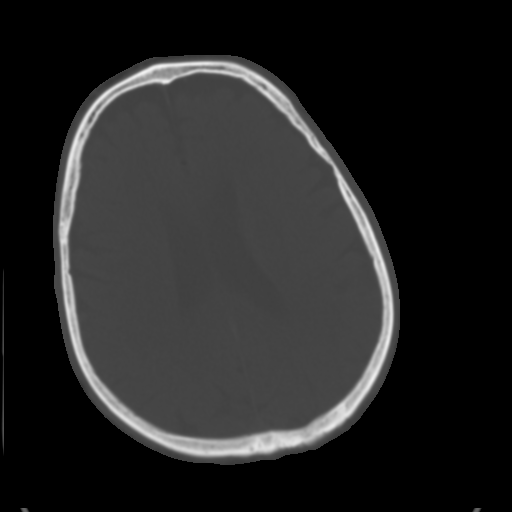
[im 24/32  brain]
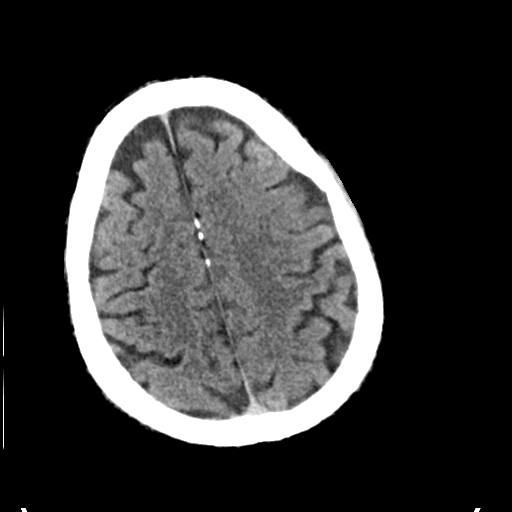
[im 28/32  brain]
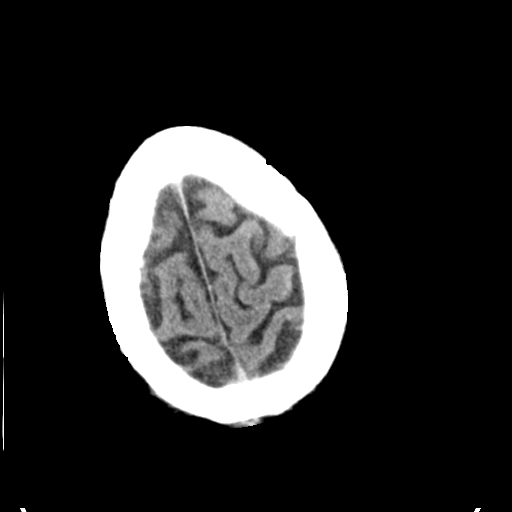

[Series 4: head bone · axial · 0.45mm/px · z∈[+564,+596]mm · 3 of 79 slices shown]
[im 8/79  bone]
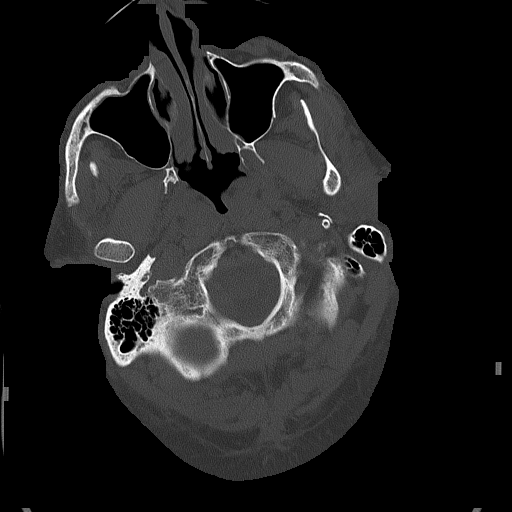
[im 16/79  bone]
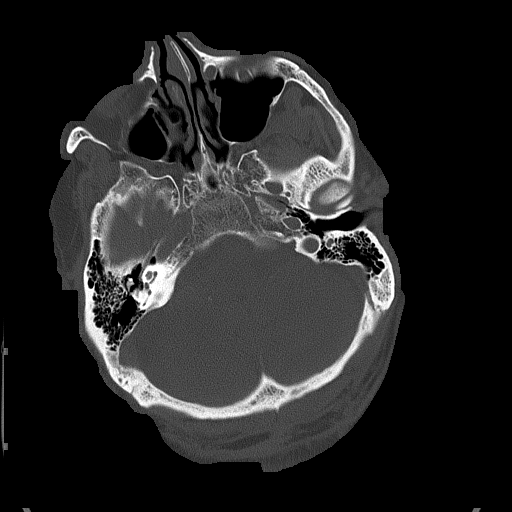
[im 24/79  bone]
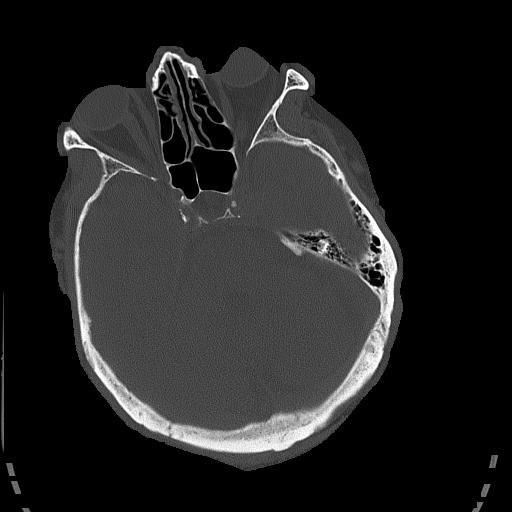

[Series 5: head without cor · coronal · non-contrast · 0.33mm/px · 3 of 77 slices shown]
[im 26/77  brain]
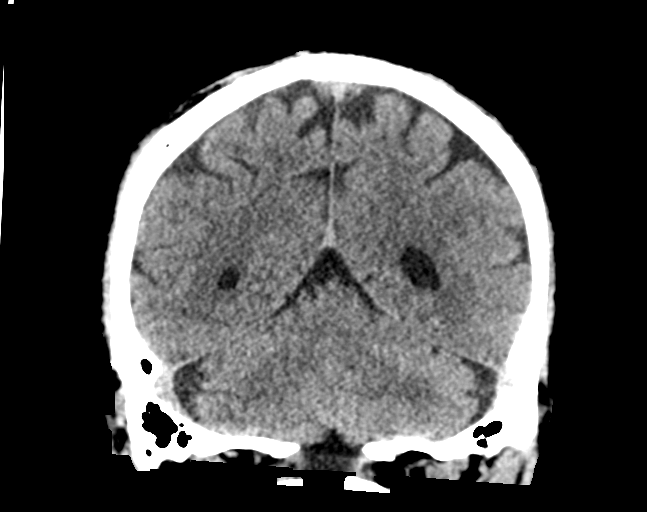
[im 34/77  brain]
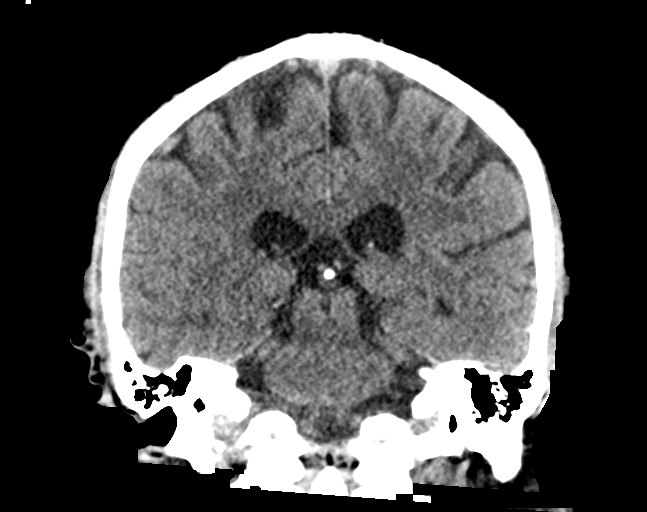
[im 43/77  brain]
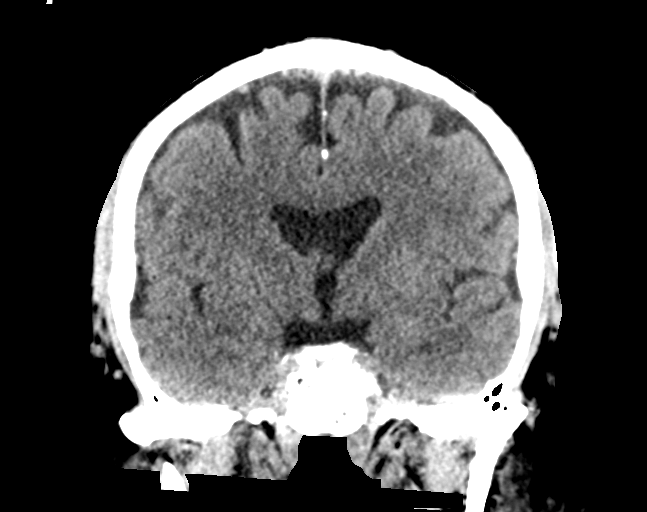

[Series 6: head without sag · sagittal · non-contrast · 0.35mm/px · 3 of 67 slices shown]
[im 24/67  brain]
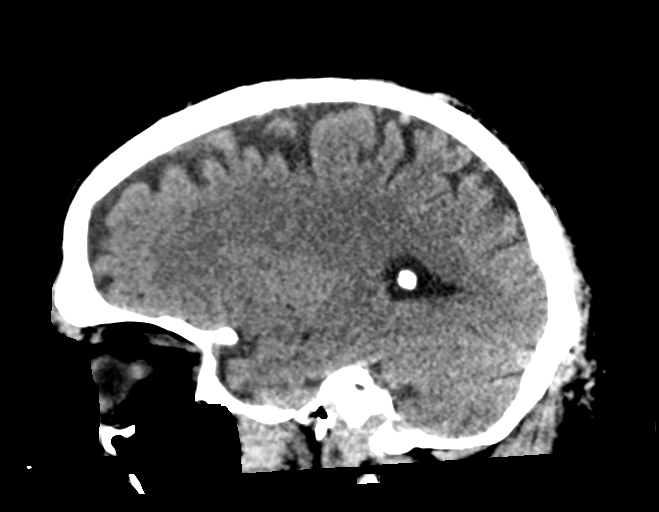
[im 34/67  brain]
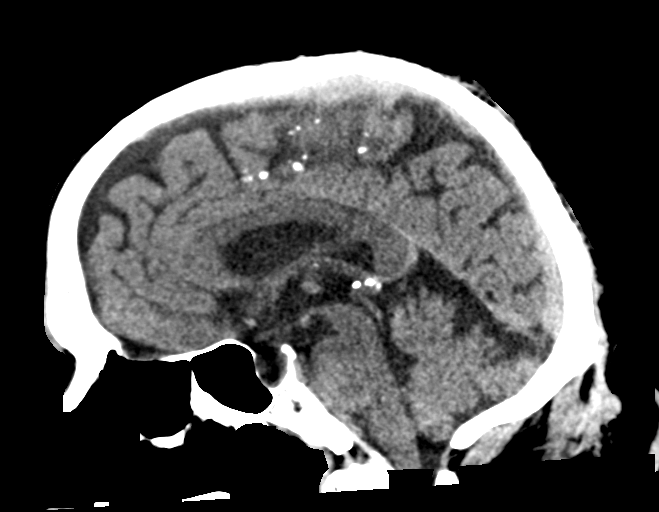
[im 44/67  brain]
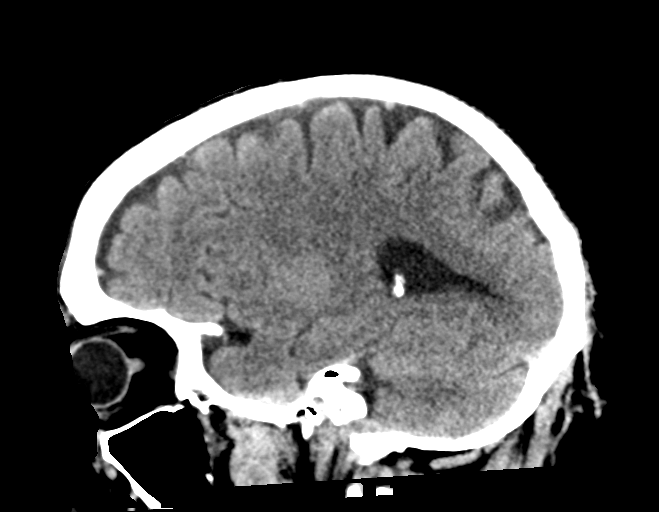

[16 of 47 positions shown; findings below may reference images not displayed]

FINDINGS: Brain: No evidence of acute territorial infarction, hemorrhage,
hydrocephalus,extra-axial collection or mass lesion/mass effect.
There is dilatation the ventricles and sulci consistent with
age-related atrophy. Low-attenuation changes in the deep white
matter consistent with small vessel ischemia.

Vascular: No hyperdense vessel or unexpected calcification.

Skull: The skull is intact. No fracture or focal lesion identified.

Sinuses/Orbits: The visualized paranasal sinuses and mastoid air
cells are clear. The orbits and globes intact.

Other: None
IMPRESSION: No acute intracranial abnormality.

Findings consistent with age related atrophy and chronic small
vessel ischemia

## 2022-05-05 ENCOUNTER — Other Ambulatory Visit: Payer: Self-pay | Admitting: Cardiology

## 2022-05-05 DIAGNOSIS — I2510 Atherosclerotic heart disease of native coronary artery without angina pectoris: Secondary | ICD-10-CM

## 2023-01-29 ENCOUNTER — Ambulatory Visit (HOSPITAL_COMMUNITY)
Admission: RE | Admit: 2023-01-29 | Discharge: 2023-01-29 | Disposition: A | Discharge status: Home / Self Care | Payer: Medicare Other | Source: Ambulatory Visit | Attending: Surgery | Admitting: Surgery

## 2023-01-29 ENCOUNTER — Other Ambulatory Visit: Payer: Self-pay

## 2023-01-29 DIAGNOSIS — I83813 Varicose veins of bilateral lower extremities with pain: Secondary | ICD-10-CM

## 2023-02-13 ENCOUNTER — Encounter: Payer: Medicare Other | Admitting: Vascular Surgery

## 2023-02-19 ENCOUNTER — Encounter: Payer: Self-pay | Admitting: Physician Assistant

## 2023-02-19 ENCOUNTER — Ambulatory Visit: Payer: Medicare Other | Admitting: Physician Assistant

## 2023-02-19 VITALS — BP 156/88 | HR 81 | Temp 98.0°F | Resp 18 | Ht 66.0 in | Wt 254.6 lb

## 2023-02-19 DIAGNOSIS — I83813 Varicose veins of bilateral lower extremities with pain: Secondary | ICD-10-CM | POA: Diagnosis not present

## 2023-02-19 DIAGNOSIS — I8393 Asymptomatic varicose veins of bilateral lower extremities: Secondary | ICD-10-CM

## 2023-02-19 DIAGNOSIS — I872 Venous insufficiency (chronic) (peripheral): Secondary | ICD-10-CM | POA: Diagnosis not present
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
## 2024-02-12 ENCOUNTER — Ambulatory Visit
Admission: RE | Admit: 2024-02-12 | Discharge: 2024-02-12 | Disposition: A | Discharge status: Home / Self Care | Source: Ambulatory Visit | Attending: Family Medicine | Admitting: Family Medicine

## 2024-02-12 ENCOUNTER — Other Ambulatory Visit: Payer: Self-pay | Admitting: Family Medicine

## 2024-02-12 DIAGNOSIS — M25551 Pain in right hip: Secondary | ICD-10-CM

## 2024-08-14 ENCOUNTER — Other Ambulatory Visit: Payer: Self-pay

## 2024-08-14 ENCOUNTER — Emergency Department (HOSPITAL_BASED_OUTPATIENT_CLINIC_OR_DEPARTMENT_OTHER)

## 2024-08-14 ENCOUNTER — Inpatient Hospital Stay (HOSPITAL_BASED_OUTPATIENT_CLINIC_OR_DEPARTMENT_OTHER)
Admission: EM | Admit: 2024-08-14 | Discharge: 2024-08-15 | DRG: 124 | Disposition: A | Discharge status: Home / Self Care | Attending: Neurology | Admitting: Neurology

## 2024-08-14 ENCOUNTER — Encounter (HOSPITAL_BASED_OUTPATIENT_CLINIC_OR_DEPARTMENT_OTHER): Payer: Self-pay | Admitting: Emergency Medicine

## 2024-08-14 ENCOUNTER — Inpatient Hospital Stay (HOSPITAL_COMMUNITY)

## 2024-08-14 DIAGNOSIS — I708 Atherosclerosis of other arteries: Secondary | ICD-10-CM | POA: Diagnosis not present

## 2024-08-14 DIAGNOSIS — F1210 Cannabis abuse, uncomplicated: Secondary | ICD-10-CM

## 2024-08-14 DIAGNOSIS — Z880 Allergy status to penicillin: Secondary | ICD-10-CM

## 2024-08-14 DIAGNOSIS — E785 Hyperlipidemia, unspecified: Secondary | ICD-10-CM | POA: Diagnosis present

## 2024-08-14 DIAGNOSIS — I2510 Atherosclerotic heart disease of native coronary artery without angina pectoris: Secondary | ICD-10-CM | POA: Diagnosis present

## 2024-08-14 DIAGNOSIS — I10 Essential (primary) hypertension: Secondary | ICD-10-CM | POA: Diagnosis present

## 2024-08-14 DIAGNOSIS — H3411 Central retinal artery occlusion, right eye: Principal | ICD-10-CM

## 2024-08-14 DIAGNOSIS — N179 Acute kidney failure, unspecified: Secondary | ICD-10-CM | POA: Diagnosis present

## 2024-08-14 DIAGNOSIS — E669 Obesity, unspecified: Secondary | ICD-10-CM | POA: Diagnosis present

## 2024-08-14 DIAGNOSIS — G8929 Other chronic pain: Secondary | ICD-10-CM | POA: Diagnosis present

## 2024-08-14 DIAGNOSIS — H5460 Unqualified visual loss, one eye, unspecified: Principal | ICD-10-CM

## 2024-08-14 DIAGNOSIS — Z823 Family history of stroke: Secondary | ICD-10-CM | POA: Diagnosis not present

## 2024-08-14 DIAGNOSIS — Z7982 Long term (current) use of aspirin: Secondary | ICD-10-CM | POA: Diagnosis not present

## 2024-08-14 DIAGNOSIS — G629 Polyneuropathy, unspecified: Secondary | ICD-10-CM | POA: Diagnosis present

## 2024-08-14 DIAGNOSIS — Z955 Presence of coronary angioplasty implant and graft: Secondary | ICD-10-CM

## 2024-08-14 DIAGNOSIS — R739 Hyperglycemia, unspecified: Secondary | ICD-10-CM

## 2024-08-14 DIAGNOSIS — I639 Cerebral infarction, unspecified: Secondary | ICD-10-CM | POA: Diagnosis present

## 2024-08-14 DIAGNOSIS — Z833 Family history of diabetes mellitus: Secondary | ICD-10-CM | POA: Diagnosis not present

## 2024-08-14 DIAGNOSIS — N289 Disorder of kidney and ureter, unspecified: Secondary | ICD-10-CM

## 2024-08-14 DIAGNOSIS — Z6837 Body mass index (BMI) 37.0-37.9, adult: Secondary | ICD-10-CM

## 2024-08-14 LAB — URINALYSIS, ROUTINE W REFLEX MICROSCOPIC
Bilirubin Urine: NEGATIVE
Glucose, UA: NEGATIVE mg/dL
Hgb urine dipstick: NEGATIVE
Ketones, ur: 5 mg/dL — AB
Leukocytes,Ua: NEGATIVE
Nitrite: NEGATIVE
Protein, ur: NEGATIVE mg/dL
Specific Gravity, Urine: 1.026 (ref 1.005–1.030)
pH: 6 (ref 5.0–8.0)

## 2024-08-14 LAB — CBC
HCT: 45.7 % (ref 39.0–52.0)
Hemoglobin: 15.2 g/dL (ref 13.0–17.0)
MCH: 29 pg (ref 26.0–34.0)
MCHC: 33.3 g/dL (ref 30.0–36.0)
MCV: 87.2 fL (ref 80.0–100.0)
Platelets: 232 K/uL (ref 150–400)
RBC: 5.24 MIL/uL (ref 4.22–5.81)
RDW: 13.7 % (ref 11.5–15.5)
WBC: 6.8 K/uL (ref 4.0–10.5)
nRBC: 0 % (ref 0.0–0.2)

## 2024-08-14 LAB — COMPREHENSIVE METABOLIC PANEL WITH GFR
ALT: 15 U/L (ref 0–44)
AST: 32 U/L (ref 15–41)
Albumin: 4.7 g/dL (ref 3.5–5.0)
Alkaline Phosphatase: 67 U/L (ref 38–126)
Anion gap: 15 (ref 5–15)
BUN: 27 mg/dL — ABNORMAL HIGH (ref 8–23)
CO2: 26 mmol/L (ref 22–32)
Calcium: 9.8 mg/dL (ref 8.9–10.3)
Chloride: 98 mmol/L (ref 98–111)
Creatinine, Ser: 1.34 mg/dL — ABNORMAL HIGH (ref 0.61–1.24)
GFR, Estimated: 56 mL/min — ABNORMAL LOW (ref >60)
Glucose, Bld: 110 mg/dL — ABNORMAL HIGH (ref 70–99)
Potassium: 3.7 mmol/L (ref 3.5–5.1)
Sodium: 138 mmol/L (ref 135–145)
Total Bilirubin: 0.5 mg/dL (ref 0.0–1.2)
Total Protein: 8 g/dL (ref 6.5–8.1)

## 2024-08-14 LAB — MRSA NEXT GEN BY PCR, NASAL: MRSA by PCR Next Gen: NOT DETECTED

## 2024-08-14 LAB — ECHOCARDIOGRAM COMPLETE
AR max vel: 1.76 cm2
AV Area VTI: 1.82 cm2
AV Area mean vel: 1.75 cm2
AV Mean grad: 6.5 mmHg
AV Peak grad: 12.2 mmHg
Ao pk vel: 1.75 m/s
Area-P 1/2: 3.21 cm2
S' Lateral: 3 cm
Weight: 3682.56 [oz_av]

## 2024-08-14 LAB — RAPID URINE DRUG SCREEN, HOSP PERFORMED
Amphetamines: NOT DETECTED
Barbiturates: NOT DETECTED
Benzodiazepines: NOT DETECTED
Cocaine: NOT DETECTED
Opiates: NOT DETECTED
Tetrahydrocannabinol: POSITIVE — AB

## 2024-08-14 LAB — HEMOGLOBIN A1C
Hgb A1c MFr Bld: 5.3 % (ref 4.8–5.6)
Mean Plasma Glucose: 105.41 mg/dL

## 2024-08-14 LAB — PROTIME-INR
INR: 1 (ref 0.8–1.2)
Prothrombin Time: 13.6 s (ref 11.4–15.2)

## 2024-08-14 LAB — LIPID PANEL
Cholesterol: 175 mg/dL (ref 0–200)
HDL: 39 mg/dL — ABNORMAL LOW (ref >40)
LDL Cholesterol: 122 mg/dL — ABNORMAL HIGH (ref 0–99)
Total CHOL/HDL Ratio: 4.5 ratio
Triglycerides: 72 mg/dL (ref <150)
VLDL: 14 mg/dL (ref 0–40)

## 2024-08-14 LAB — DIFFERENTIAL
Abs Immature Granulocytes: 0.03 K/uL (ref 0.00–0.07)
Basophils Absolute: 0.1 K/uL (ref 0.0–0.1)
Basophils Relative: 1 %
Eosinophils Absolute: 0.2 K/uL (ref 0.0–0.5)
Eosinophils Relative: 2 %
Immature Granulocytes: 0 %
Lymphocytes Relative: 22 %
Lymphs Abs: 1.5 K/uL (ref 0.7–4.0)
Monocytes Absolute: 0.9 K/uL (ref 0.1–1.0)
Monocytes Relative: 13 %
Neutro Abs: 4.3 K/uL (ref 1.7–7.7)
Neutrophils Relative %: 62 %

## 2024-08-14 LAB — CBG MONITORING, ED: Glucose-Capillary: 108 mg/dL — ABNORMAL HIGH (ref 70–99)

## 2024-08-14 LAB — APTT: aPTT: 33 s (ref 24–36)

## 2024-08-14 MED ORDER — TENECTEPLASE 25 MG IV KIT: 25.0000 mg | PACK | Freq: Once | INTRAVENOUS | Status: DC

## 2024-08-14 MED ORDER — IOHEXOL 350 MG/ML SOLN
75.0000 mL | Freq: Once | INTRAVENOUS | Status: AC | PRN
Start: 2024-08-14 — End: 2024-08-14
  Administered 2024-08-14: 75 mL via INTRAVENOUS

## 2024-08-14 MED ORDER — ATORVASTATIN CALCIUM 40 MG PO TABS
40.0000 mg | ORAL_TABLET | Freq: Every day | ORAL | Status: DC
  Administered 2024-08-14: 40 mg via ORAL

## 2024-08-14 MED ORDER — CHLORHEXIDINE GLUCONATE CLOTH 2 % EX PADS
6.0000 | MEDICATED_PAD | Freq: Every day | CUTANEOUS | Status: DC
  Administered 2024-08-15: 6 via TOPICAL

## 2024-08-14 MED ORDER — GABAPENTIN 100 MG PO CAPS
200.0000 mg | ORAL_CAPSULE | Freq: Two times a day (BID) | ORAL | Status: DC
  Administered 2024-08-14 – 2024-08-15 (×2): 200 mg via ORAL
  Filled 2024-08-14: qty 2

## 2024-08-14 MED ORDER — SENNOSIDES-DOCUSATE SODIUM 8.6-50 MG PO TABS: 1.0000 | ORAL_TABLET | Freq: Every evening | ORAL | Status: DC | PRN

## 2024-08-14 MED ORDER — ACETAMINOPHEN 160 MG/5ML PO SOLN: 650.0000 mg | ORAL | Status: DC | PRN

## 2024-08-14 MED ORDER — STROKE: EARLY STAGES OF RECOVERY BOOK
Freq: Once | Status: AC
  Filled 2024-08-14 (×2): qty 1

## 2024-08-14 MED ORDER — PANTOPRAZOLE SODIUM 40 MG PO TBEC: 40.0000 mg | DELAYED_RELEASE_TABLET | Freq: Every day | ORAL | Status: DC

## 2024-08-14 MED ORDER — BISACODYL 10 MG RE SUPP: 10.0000 mg | Freq: Every day | RECTAL | Status: DC | PRN

## 2024-08-14 MED ORDER — TENECTEPLASE 50 MG IV KIT
PACK | INTRAVENOUS | Status: AC
  Administered 2024-08-14: 25000 ug
  Filled 2024-08-14: qty 10

## 2024-08-14 MED ORDER — ACETAMINOPHEN 325 MG PO TABS: 650.0000 mg | ORAL_TABLET | ORAL | Status: DC | PRN

## 2024-08-14 MED ORDER — PANTOPRAZOLE SODIUM 40 MG IV SOLR: 40.0000 mg | Freq: Every day | INTRAVENOUS | Status: DC

## 2024-08-14 MED ORDER — SENNOSIDES-DOCUSATE SODIUM 8.6-50 MG PO TABS
1.0000 | ORAL_TABLET | Freq: Every day | ORAL | Status: DC
  Administered 2024-08-14: 1 via ORAL
  Filled 2024-08-14: qty 1

## 2024-08-14 MED ORDER — ATORVASTATIN CALCIUM 40 MG PO TABS
40.0000 mg | ORAL_TABLET | Freq: Every day | ORAL | Status: DC
  Filled 2024-08-14: qty 1

## 2024-08-14 MED ORDER — GABAPENTIN 100 MG PO CAPS
200.0000 mg | ORAL_CAPSULE | Freq: Two times a day (BID) | ORAL | Status: DC
  Filled 2024-08-14: qty 2

## 2024-08-14 MED ORDER — ACETAMINOPHEN 650 MG RE SUPP: 650.0000 mg | RECTAL | Status: DC | PRN

## 2024-08-14 MED ORDER — SODIUM CHLORIDE 0.9 % IV SOLN: INTRAVENOUS | Status: AC

## 2024-08-14 MED ORDER — ORAL CARE MOUTH RINSE
15.0000 mL | OROMUCOSAL | Status: DC | PRN
Start: 2024-08-14 — End: 2024-08-15

## 2024-08-14 NOTE — ED Notes (Signed)
CT head complete

## 2024-08-14 NOTE — ED Provider Notes (Signed)
 [place] [facility] [place] Provider Note   CSN: [id number] Arrival date & time: 08/14/24  9942     Patient presents with: No chief complaint on file.   Rickey [name] is a 73 y.o. male.   The history is provided by the patient.   He has history of hypertension, hyperlipidemia, coronary artery disease and noted loss of vision in his right eye at about midnight.  He was watching television and the vision loss was sudden.  He denies any pain.  He denies any weakness or numbness or tingling anywhere.  He is able to see some shadows of things but nothing more.    Prior to Admission medications   Medication Sig Start Date End Date Taking? Authorizing Provider  aspirin  EC 81 MG tablet Take 1 tablet (81 mg total) by mouth daily. Swallow whole. 03/30/20   Patwardhan, Manish J, MD  atorvastatin  (LIPITOR) 40 MG tablet TAKE 1 TABLET BY MOUTH ONCE DAILY IN THE EVENING . APPOINTMENT REQUIRED FOR FUTURE REFILLS Patient not taking: Reported on 02/19/2023 01/20/22   Elmira Newman PARAS, MD  linaclotide  (LINZESS ) 145 MCG CAPS capsule Take 1 capsule (145 mcg total) by mouth daily before breakfast for 3 days. 03/16/22 03/19/22  Joesph Shaver Scales, PA-C  lisinopril  (ZESTRIL ) 10 MG tablet TAKE 1 TABLET BY MOUTH ONCE DAILY . APPOINTMENT REQUIRED FOR FUTURE REFILLS 01/20/22   Patwardhan, Newman PARAS, MD  meclizine  (ANTIVERT ) 25 MG tablet Take 1 tablet (25 mg total) by mouth 3 (three) times daily as needed for dizziness. 01/03/21   Mesner, Selinda, MD  vitamin C (ASCORBIC ACID) 500 MG tablet Take 500 mg by mouth daily.    [provider]    Allergies: Penicillins    Review of Systems  All other systems reviewed and are negative.   Updated Vital Signs BP 122/85 (BP Location: Right Arm)   Pulse 94   Temp 98.2 F (36.8 C) (Oral)   Resp 20   Wt 104.4 kg   SpO2 97%   BMI 37.15 kg/m   Physical Exam Vitals and nursing note reviewed.   73 year old male, resting  comfortably and in no acute distress. Vital signs are normal. Oxygen saturation is 99%, which is normal. Head is normocephalic and atraumatic. PERRLA, EOMI. Oropharynx is clear.  Fundi appear normal.  Optic discs are sharp. Neck is nontender and supple without adenopathy or JVD.  There are no carotid bruits. Lungs are clear without rales, wheezes, or rhonchi. Chest is nontender. Heart has regular rate and rhythm without murmur. Abdomen is soft, flat, nontender. Extremities have no 2+ pitting edema. Skin is warm and dry without rash. Neurologic: Awake and alert and oriented, speech is normal.  Right eye has ability to see light but not hand movement.  No visual field cuts on the left eye.  There is no facial asymmetry, sensation is normal throughout the face.  Tongue protrudes in the midline.  Shrug is normal.  There is no pronator drift and no extinction on double simultaneous stimulation.  Strength is 5/5 in all 4 extremities..  Nose testing is normal bilaterally.  (all labs ordered are listed, but only abnormal results are displayed) Labs Reviewed - No data to display  EKG: TYREN, DUGAR PI:[id number] 14-Aug-2024 02:07:11 [facility] ROUTINE RECORD [birth year] (73 yr) Male Caucasian Room:HPER5 Loc:0 Technician: Test ind: Vent. rate 74 BPM PR interval 141 ms QRS duration 103 ms QT/QTcB 384/426 ms P-R-T axes 55 -5 -18 Sinus  rhythm Inferoposterior infarct, age indeterminate Lateral leads are also involved When compared with ECG of 01/02/2021, No significant change was found Confirmed by Raford Lenis ([id number]) on 08/14/2024 2:21:28 AM Confirmed By: Lenis Raford  Radiology: No results found.   Procedures   Medications Ordered in the ED - No data to display                                  Medical Decision Making Amount and/or Complexity of Data Reviewed Labs: ordered. Radiology: ordered.  Risk Prescription drug management. Decision regarding  hospitalization.   Monocular vision loss.  This is a presentation with a wide range of treatment options and carries with it a high risk of morbidity and complications.  Differential diagnosis includes, but is not limited to, ocular stroke, retinal artery occlusion, vitreous hemorrhage, retinal detachment.  On presentation, code stroke was activated and patient has been seen by teleneurology who have ordered TNK for concern for ocular stroke.  I have ordered code stroke workup.  He will need to be admitted to [facility].  I have discussed the case with Dr. Vanessa, on-call for neurology who agrees to accept the patient.  He does request CT angiogram of the head and neck be obtained and this is ordered.  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  CT angiogram of head and neck showed no large vessel occlusion or hemodynamically significant stenosis.  I have independently viewed the images, and agree with the radiologist's interpretation.  I have reviewed his laboratory tests, and my interpretation is normal CBC, mildly elevated random glucose level which will need to be followed as an outpatient, mild renal insufficiency which is new compared with 01/02/2021.  As of transfer to [facility], he has not noticed any return of vision.  CRITICAL CARE Performed by: Lenis Raford Total critical care time: 60 minutes Critical care time was exclusive of separately billable procedures and treating other patients. Critical care was necessary to treat or prevent imminent or life-threatening deterioration. Critical care was time spent personally by me on the following activities: development of treatment plan with patient and/or surrogate as well as nursing, discussions with consultants, evaluation of patient's response to treatment, examination of patient, obtaining history from patient or surrogate, ordering and performing treatments and  interventions, ordering and review of laboratory studies, ordering and review of radiographic studies, pulse oximetry and re-evaluation of patient's condition.     Final diagnoses:  Monocular vision loss  Elevated random blood glucose level  Renal insufficiency    ED Discharge Orders     None          Raford Lenis, MD 08/14/24 [phone]

## 2024-08-14 NOTE — Progress Notes (Signed)
 PT Cancellation Note  Patient Details Name: Rickey [name] MRN: [id number] DOB: March 10, 1951   Cancelled Treatment:    Reason Eval/Treat Not Completed: Active bedrest order  Patient with bedrest order as of 11/09 0207 a.m. Will monitor for appropriateness to proceed with evaluation.    Macario RAMAN, PT Acute Rehabilitation Services  Office [phone]  Macario SHAUNNA Soja 08/14/2024, 7:30 AM

## 2024-08-14 NOTE — ED Notes (Signed)
Pt arrived in CT 

## 2024-08-14 NOTE — Progress Notes (Signed)
 OT Cancellation Note  Patient Details Name: RANDALE CARVALHO MRN: [id number] DOB: 1951/04/26   Cancelled Treatment:    Reason Eval/Treat Not Completed: Active bedrest order.  Will monitor to see if BR remains later today.    Serenitie Vinton D Kriss Perleberg 08/14/2024, 8:28 AM 08/14/2024  RP, OTR/L  Acute Rehabilitation Services  Office:  [phone]

## 2024-08-14 NOTE — ED Notes (Signed)
 [facility] called for transport.

## 2024-08-14 NOTE — ED Notes (Signed)
 Pt states R eye vision might be a slight percent better.

## 2024-08-14 NOTE — ED Triage Notes (Addendum)
 Activated code stroke. Pt with R eye vision loss about 1 hour ago, has not improved. Denies other deficit. Pt walked into triage room unassisted. No facial droop, drooling or aphasia noted. Pt providing his own hx. EDP notified. LKW [id number].

## 2024-08-14 NOTE — ED Notes (Signed)
 Pt leaving facility with [facility].

## 2024-08-14 NOTE — Progress Notes (Addendum)
 STROKE TEAM PROGRESS NOTE    SIGNIFICANT HOSPITAL EVENTS  11/9 early AM: CODE STROKE (@ [facility]) d/t acute right eye complete vision loss (described as going dark with a gray shade).  Given TNK @ 0144 Transferred to [facility]  INTERIM HISTORY/SUBJECTIVE  SLP in room. RN at bedside, no family at bedside.     While left eye is covered, peripheral vision is okay but central vision is absent. Patient states it started to improve about 24m after getting the TNK. No issues with left vision.   Cr elevated this am, 1.34 from 0.88, received contrast yesterday, will continue IVF at 75/hr for s few hours. Patient's PTA med Gabapentin restarted at half dose. BP stable, on lower end. No need for PRNs to stay within goal.   PENDING MRI tonight, 24hours post-TNK.    OBJECTIVE  CBC    Component Value Date/Time   WBC 6.8 08/14/2024 0057   RBC 5.24 08/14/2024 0057   HGB 15.2 08/14/2024 0057   HCT 45.7 08/14/2024 0057   PLT 232 08/14/2024 0057   MCV 87.2 08/14/2024 0057   MCH 29.0 08/14/2024 0057   MCHC 33.3 08/14/2024 0057   RDW 13.7 08/14/2024 0057   LYMPHSABS 1.5 08/14/2024 0057   MONOABS 0.9 08/14/2024 0057   EOSABS 0.2 08/14/2024 0057   BASOSABS 0.1 08/14/2024 0057    BMET    Component Value Date/Time   NA 138 08/14/2024 0057   NA 140 04/06/2020 1154   K 3.7 08/14/2024 0057   CL 98 08/14/2024 0057   CO2 26 08/14/2024 0057   GLUCOSE 110 (H) 08/14/2024 0057   BUN 27 (H) 08/14/2024 0057   BUN 12 04/06/2020 1154   CREATININE 1.34 (H) 08/14/2024 0057   CALCIUM  9.8 08/14/2024 0057   GFRNONAA 56 (L) 08/14/2024 0057    IMAGING past 24 hours CT ANGIO HEAD NECK W WO CM Result Date: 08/14/2024 EXAM: CTA HEAD AND NECK WITHOUT AND WITH 08/14/2024 02:30:00 AM TECHNIQUE: CTA of the head and neck was performed without and with the administration of intravenous contrast. Multiplanar 2D and/or 3D reformatted images are provided for review. Automated exposure control, iterative reconstruction, and/or  weight based adjustment of the mA/kV was utilized to reduce the radiation dose to as low as reasonably achievable. Stenosis of the internal carotid arteries measured using NASCET criteria. COMPARISON: Same day CT head. CLINICAL HISTORY: Neuro deficit, acute, stroke suspected FINDINGS: AORTIC ARCH AND ARCH VESSELS: Aberrant origin of the right subclavian artery, anatomic variant. No significant stenosis of the brachiocephalic or subclavian arteries. CERVICAL CAROTID ARTERIES: No dissection, arterial injury, or hemodynamically significant stenosis by NASCET criteria. CERVICAL VERTEBRAL ARTERIES: No dissection, arterial injury, or significant stenosis. LUNGS AND MEDIASTINUM: Unremarkable. SOFT TISSUES: No acute abnormality. BONES: No acute abnormality. ANTERIOR CIRCULATION: No significant stenosis of the internal carotid arteries. No significant stenosis of the anterior cerebral arteries. No significant stenosis of the middle cerebral arteries. No aneurysm. POSTERIOR CIRCULATION: No significant stenosis of the posterior cerebral arteries. No significant stenosis of the basilar artery. No significant stenosis of the vertebral arteries. No aneurysm. OTHER: No dural venous sinus thrombosis on this non-dedicated study. IMPRESSION: 1. No large vessel occlusion or hemodynamically significant stenosis. Electronically signed by: Gilmore Molt MD 08/14/2024 02:37 AM EST RP Workstation: [id number]   CT HEAD CODE STROKE WO CONTRAST` Result Date: 08/14/2024 EXAM: CT HEAD WITHOUT CONTRAST 08/14/2024 01:25:00 AM TECHNIQUE: CT of the head was performed without the administration of intravenous contrast. Automated exposure control, iterative reconstruction, and/or weight based  adjustment of the mA/kV was utilized to reduce the radiation dose to as low as reasonably achievable. COMPARISON: None available. CLINICAL HISTORY: Neuro deficit, acute, stroke suspected. FINDINGS: BRAIN AND VENTRICLES: No acute hemorrhage. No evidence of  acute infarct. No hydrocephalus. No extra-axial collection. No mass effect or midline shift. ORBITS: No acute abnormality. SINUSES: No acute abnormality. SOFT TISSUES AND SKULL: No skull fracture. Findings discussed with Dr. Raford via telephone at 1:34 AM. IMPRESSION: 1. No evidence of  acute intracranial abnormality. Electronically signed by: Gilmore Molt MD 08/14/2024 01:35 AM EST RP Workstation: [id number]    Vitals:   08/14/24 0500 08/14/24 0600 08/14/24 0700 08/14/24 0800  BP: 100/72 102/68 115/74   Pulse: 61 (!) 57 (!) 57   Resp: 14 12 12    Temp:    97.6 F (36.4 C)  TempSrc:    Axillary  SpO2: 92% 92% 94%   Weight:       PHYSICAL EXAM General:  Alert, well-nourished, well-developed patient in no acute distress Psych:  Mood and affect appropriate for situation CV: Regular rate and rhythm on monitor Respiratory:  Regular, unlabored respirations on room air GI: Abdomen soft and nontender  NEURO:  Mental Status: AA&Ox3, patient is able to give clear and coherent history Speech/Language: No aphasia.  Naming, repetition, fluency, and comprehension intact.  Cranial Nerves:  II: PERRL. Complete central vision loss in right eye, peripheral vision fully intact.  III, IV, VI: EOMI. Tracking bilaterally. Eyelids elevate symmetrically.  V: Sensation is intact to light touch and symmetrical to face.  VII: Face is symmetrical resting and smiling VIII: hearing intact to voice. IX, X: Palate elevates symmetrically. No dysarthria.  KP:Dynloizm shrug 5/5. XII: tongue is midline without fasciculations. Motor: 5/5 strength to all muscle groups tested.  Tone: is normal and bulk is normal Sensation- Intact to light touch bilaterally.  Coordination: FTN intact bilaterally.  Gait- deferred  Most Recent NIH 2   ASSESSMENT/PLAN  Rickey [name] is a 73 y.o. male presented with episode of sudden onset painless right eye complete vision loss concerning for CRAO.  He presented to [facility] ED where he was given TNKase he was transferred to [facility] for post [facility] monitoring.  NIH on Admission 1.  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  CTA head & neck: No large vessel occlusion or hemodynamically significant stenosis.  MRI  PENDING 2D Echo EF 60 to 65% LDL 122 HgbA1c 5.3 UDS positive for THC VTE prophylaxis -SCDs aspirin  81 mg daily prior to admission, now on No antithrombotic pending 24hour post-TNK imaging Therapy recommendations: None Disposition: Pending  Hypertension Home meds:  Lisinopril  10mg  Stable Blood Pressure Goal: BP less than 180/105  Long-term BP goal normotensive  Hyperlipidemia Home meds: none LDL 122, goal < 70 Add Lipitor 40mg    Continue statin at discharge  Marijuana abuse Patient uses marijuana UDS positive for  THC        Ready to quit? Yes Cessation education provided  Other Stroke Risk Factors Obesity, Body mass index is 37.15 kg/m., BMI >/= 30 associated with increased stroke risk, recommend weight loss, diet and exercise as appropriate  CAD  Other Active Problems Chronic Pain PTA: Gabapenitn 400mg  BID--restarted at 200mg  AKI, CR 1.34 Continue NS @ 75ml/hr for 6 hours Recheck daily BMP Hx of constipation PTA: Linzess  Senna daily, Dulcolax suppository PRN  Hospital day # 0   Pt seen by Neuro NP/APP with  MD. Note/plan to be edited by MD as needed.    Rickey JAYSON Likes, DNP [facility] Please use AMION for contact information & EPIC for messaging.  ATTENDING NOTE: I reviewed above note and agree with the assessment and plan. Pt was seen and examined.   Speech therapist is at the bedside. Pt is awake, alert, eyes open, orientated to age, place, time and people. No aphasia, fluent language, following all simple commands. Able to name and repeat. No gaze palsy, tracking bilaterally, visual field full except R eye central vision decreased  acuity, PERRL. No facial droop. Tongue midline. Bilateral UEs 5/5, no drift. Bilaterally LEs 5/5, no drift. Sensation symmetrical bilaterally, b/l FTN intact, gait not tested.   For detailed assessment and plan, please refer to above as I have made changes wherever appropriate.   Rickey Cummins, MD PhD Stroke Neurology 08/14/2024 6:05 PM  This patient is critically ill due to CRAO status post TNK and at significant risk of neurological worsening, death form recurrent stroke, bleeding from TNK. This patient's care requires constant monitoring of vital signs, hemodynamics, respiratory and cardiac monitoring, review of multiple databases, neurological assessment, discussion with family, other specialists and medical decision making of high complexity. I spent 30 minutes of neurocritical care time in the care of this patient.     To contact Stroke Continuity provider, please refer to [email or web address]. After hours, contact General Neurology

## 2024-08-14 NOTE — Progress Notes (Signed)
   08/14/24 0800  SLP Visit Information  SLP Received On 08/14/24  General Information  HPI Rickey [name] is a 73 y.o. male presented with episode of sudden onset painless right eye complete vision loss concerning for CRAO.  He presented to [facility] ED where he was given TNKase he was transferred to [facility] for post [facility] monitoring.  Prior Functional Status  Cognitive/Linguistic Baseline Baseline deficits (mild memory deficits)  Baseline deficit details Pt has noticed some changes in memory   Lives With Spouse  Vocation Retired  Oral Motor/Sensory Function  Overall Oral Motor/Sensory Function WFL  Cognition  Overall Cognitive Status Within Functional Limits for tasks assessed  Auditory Comprehension  Overall Auditory Comprehension Appears within functional limits for tasks assessed  Verbal Expression  Overall Verbal Expression Appears within functional limits for tasks assessed  Motor Speech  Overall Motor Speech Appears within functional limits for tasks assessed  Assessment  Clinical Impression Statement (ACUTE ONLY) Pt scored slightly outside of normal limits on SLUMS due to difficulty with memory tasks. Performance improved after pt admitted to some hearing loss and pt set up for better hearing. Pt is aware of these changes over the past few years. Uses some basic compensations at home to make sure he has taken his medication correctly and we discussed how to modify these if his routine becomes more complex. Otherwise langauge and cognition are WNL.  SLP Recommendation/Assessment Patient does not need any further Speech Language Pathology Services  No Skilled Speech Therapy Patient at baseline level of functioning  SLP Recommendations  Follow Up Recommendations No SLP follow up  Individuals Consulted  Consulted and Agree with Results and Recommendations Patient  SLP Evaluations  $ SLP Speech Visit 1 Visit  SLP Evaluations  $ SLP EVAL LANGUAGE/SOUND  PRODUCTION 1 Procedure

## 2024-08-14 NOTE — H&P (Signed)
 NEUROLOGY H&P NOTE   Date of service: August 14, 2024 Patient Name: Rickey [name] MRN:  [id number] DOB:  01/05/1951 Chief Complaint: R eye vision loss  History of Present Illness  [place] WENZLICK is a 73 y.o. male with hx of CAD, HTN, HLD, who was watching TV at midnight when he had sudden onset right eye complete visual loss.  He describes it as the right eye vision going dark with a gray shade.  He denies any curtain falling down, no redness, no floaters, no flashes.  He denies any pain in his right eye.  He rubbed his eye blinked a little bit and it did not get better so he went to the ED where a code stroke was activated.  Patient was eval by teleneurology and given TNKase.  Patient was transferred to [facility] for post [facility] monitoring and for further workup.  Patient reports that around 4 AM, he feels his vision is slightly better.  He denies any prior history of strokes, no family history of strokes, he does not smoke, he uses marijuana.  He socially drinks alcohol about 1 beer a week.  He endorses history of prediabetes.  Last known well: Midnight Modified rankin score: 0-Completely asymptomatic and back to baseline post- stroke IV Thrombolysis: Yes, given at [facility] ED. Thrombectomy: No, symptoms are not consistent with an LVO.  NIHSS components Score: Comment  1a Level of Conscious 0[]  1[]  2[]  3[]      1b LOC Questions 0[]  1[]  2[]       1c LOC Commands 0[]  1[]  2[]       2 Best Gaze 0[]  1[]  2[]       3 Visual 0[]  1[]  2[]  3[]      4 Facial Palsy 0[]  1[]  2[]  3[]      5a Motor Arm - left 0[]  1[]  2[]  3[]  4[]  UN[]    5b Motor Arm - Right 0[]  1[]  2[]  3[]  4[]  UN[]    6a Motor Leg - Left 0[]  1[]  2[]  3[]  4[]  UN[]    6b Motor Leg - Right 0[]  1[]  2[]  3[]  4[]  UN[]    7 Limb Ataxia 0[]  1[]  2[]  UN[]      8 Sensory 0[]  1[]  2[]  UN[]      9 Best Language 0[]  1[]  2[]  3[]      10 Dysarthria 0[]  1[]  2[]  UN[]      11 Extinct. and Inattention 0[]  1[]  2[]       TOTAL: 0      ROS   Comprehensive ROS performed and pertinent positives documented in the HPI  Past History   Past Medical History:  Diagnosis Date   CAD (coronary artery disease)    Hyperlipidemia    Hypertension    Past Surgical History:  Procedure Laterality Date   CORONARY STENT PLACEMENT     2012   Family History  Problem Relation Age of Onset   Diabetes Mother    Cancer Father    Stroke Father    Social History   Socioeconomic History   Marital status: Single    Spouse name: Not on file   Number of children: 1   Years of education: Not on file   Highest education level: Not on file  Occupational History   Not on file  Tobacco Use   Smoking status: Never   Smokeless tobacco: Never  Vaping Use   Vaping status: Never Used  Substance and Sexual Activity   Alcohol use: Yes    Comment: occ   Drug use: Yes  Types: Marijuana   Sexual activity: Not on file  Other Topics Concern   Not on file  Social History Narrative   Not on file   Social Drivers of Health   Financial Resource Strain: Not on file  Food Insecurity: Not on file  Transportation Needs: Not on file  Physical Activity: Not on file  Stress: Not on file  Social Connections: Not on file   Allergies  Allergen Reactions   Penicillins     Medications   Current Facility-Administered Medications:    [START ON 08/15/2024]  stroke: early stages of recovery book, , Does not apply, Once, Chay Mazzoni, MD   0.9 %  sodium chloride infusion, , Intravenous, Continuous, Janvi Ammar, MD, Stopped at 08/14/24 0247   acetaminophen (TYLENOL) tablet 650 mg, 650 mg, Oral, Q4H PRN **OR** acetaminophen (TYLENOL) 160 MG/5ML solution 650 mg, 650 mg, Per Tube, Q4H PRN **OR** acetaminophen (TYLENOL) suppository 650 mg, 650 mg, Rectal, Q4H PRN, Latangela Mccomas, MD   Chlorhexidine Gluconate Cloth 2 % PADS 6 each, 6 each, Topical, Q0600, Maevyn Riordan, MD   Oral care mouth rinse, 15 mL, Mouth Rinse, PRN, Lamaria Hildebrandt,  Alyzae Hawkey, MD   pantoprazole (PROTONIX) injection 40 mg, 40 mg, Intravenous, QHS, Firman Petrow, MD   senna-docusate (Senokot-S) tablet 1 tablet, 1 tablet, Oral, QHS PRN, Kelci Petrella, MD   tenecteplase (TNKASE) injection for stroke 25 mg, 25 mg, Intravenous, Once, Zamani, Parham J, MD   Vitals   Vitals:   08/14/24 0244 08/14/24 0245 08/14/24 0338 08/14/24 0400  BP: 131/74 129/76 123/81 109/72  Pulse: 84 67 71 87  Resp: (!) 21 14 12 15   Temp:    98.1 F (36.7 C)  TempSrc:    Oral  SpO2: 96% 99% 93% 92%  Weight:         Body mass index is 37.15 kg/m.  Physical Exam   General: Laying comfortably in bed; in no acute distress.  HENT: Normal oropharynx and mucosa. Normal external appearance of ears and nose.  Neck: Supple, no pain or tenderness  CV: No JVD. No peripheral edema.  Pulmonary: Symmetric Chest rise. Normal respiratory effort.  Abdomen: Soft to touch, non-tender.  Ext: No cyanosis, edema, or deformity  Skin: No rash. Normal palpation of skin.   Musculoskeletal: Normal digits and nails by inspection. No clubbing.   Neurologic Examination  Mental status/Cognition: Alert, oriented to self, place, month and year, good attention.  Speech/language: Fluent, comprehension intact, object naming intact, repetition intact.  Cranial nerves:   CN II Pupils equal and reactive to light, significantly decreased visual acuity in R eye   CN III,IV,VI EOM intact, no gaze preference or deviation, no nystagmus    CN V normal sensation in V1, V2, and V3 segments bilaterally    CN VII no asymmetry, no nasolabial fold flattening    CN VIII normal hearing to speech    CN IX & X normal palatal elevation, no uvular deviation    CN XI 5/5 head turn and 5/5 shoulder shrug bilaterally    CN XII midline tongue protrusion    Motor:  Muscle bulk: normal, tone normal, pronator drift none tremor none Mvmt Root Nerve  Muscle Right Left Comments  SA C5/6 Ax Deltoid 5 5   EF C5/6 Mc Biceps  5 5   EE C6/7/8 Rad Triceps 5 5   WF C6/7 Med FCR     WE C7/8 PIN ECU     F Ab C8/T1 U ADM/FDI 5 5  HF L1/2/3 Fem Illopsoas 5 5   KE L2/3/4 Fem Quad 5 5   DF L4/5 D Peron Tib Ant 5 5   PF S1/2 Tibial Grc/Sol 5 5    Sensation:  Light touch Intact throughout   Pin prick    Temperature    Vibration   Proprioception    Coordination/Complex Motor:  - Finger to Nose intact bilaterally - Heel to shin intact bilaterally - Rapid alternating movement are normal - Gait: Deferred for patient safety. Labs   CBC:  Recent Labs  Lab 08/14/24 0057  WBC 6.8  NEUTROABS 4.3  HGB 15.2  HCT 45.7  MCV 87.2  PLT 232   Basic Metabolic Panel:  Lab Results  Component Value Date   NA 138 08/14/2024   K 3.7 08/14/2024   CO2 26 08/14/2024   GLUCOSE 110 (H) 08/14/2024   BUN 27 (H) 08/14/2024   CREATININE 1.34 (H) 08/14/2024   CALCIUM  9.8 08/14/2024   GFRNONAA 56 (L) 08/14/2024   GFRAA 100 04/06/2020   Lipid Panel:  Lab Results  Component Value Date   LDLCALC 94 04/06/2020   HgbA1c: No results found for: HGBA1C Urine Drug Screen: No results found for: LABOPIA, COCAINSCRNUR, LABBENZ, AMPHETMU, THCU, LABBARB  Alcohol Level No results found for: [facility] INR  Lab Results  Component Value Date   INR 1.0 08/14/2024   APTT  Lab Results  Component Value Date   APTT 33 08/14/2024     CT Head without contrast(Personally reviewed): CTH was negative for a large hypodensity concerning for a large territory infarct or hyperdensity concerning for an ICH  CT angio Head and Neck with contrast(Personally reviewed): No LVO  MRI Brain(Personally reviewed): Pending  Assessment   XAVYER STEENSON is a 73 y.o. male presented with episode of sudden onset painless right eye complete vision loss concerning for CRAO.  He presented to [facility] ED where he was given TNKase he was transferred to [facility] for post [facility] monitoring.  Impression: Right eye  CRAO.  Secondary Diagnosis: Essential (primary) hypertension  Recommendations   - Frequent NeuroChecks for post tNK care per stroke unit protocol: - Initial CTH demonstrated no acute hemorrhage or mass - MRI Brain -pending - CTA - no LVO - TTE - pending - Lipid Panel: LDL - pending  - Statin: if LDL > 70 - HbA1c: pending - Antithrombotic: Start ASA 81 mg daily if 24 h CTH does not show acute hemorrhage - DVT prophylaxis: SCDs. Pharmacologic prophylaxis if 24 h CTH does not demonstrate acute hemorrhage - Systolic Blood Pressure goal: < 180 mm Hg - Telemetry monitoring for arrhythmia: 72 hours - Swallow screen - ordered - PT/OT/SLP consults  ______________________________________________________________________  I personally spent a total of 75 minutes in the care of the patient today including preparing to see the patient, getting/reviewing separately obtained history, performing a medically appropriate exam/evaluation, counseling and educating, placing orders, referring and communicating with other health care professionals, documenting clinical information in the EHR, independently interpreting results, communicating results, and coordinating care.  Plan initially discussed with Dr. Raford with the ED team at [facility] ED.  Allergies verified and patient denies any known allergies.  CODE STATUS verified and patient is full code.  Patient would like his wife Ms. Mikko Lewellen to be her healthcare power of attorney in case if he is unable to make medical decisions by himself.1  Signed, Yarithza Mink, MD [facility]

## 2024-08-14 NOTE — ED Notes (Signed)
 Arrived to triage to assist RN with code stroke activation.  Iv access gained and blood collected and taken to lab.  Pt moved to CT traveled with same to assist on screen neurologist with exam.

## 2024-08-14 NOTE — Consult Note (Signed)
 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 Patient can see minimal light with affected eye, mostly gray vision. Pupillary reflex present bilaterally with right pupil slower but reactive. CT head completed. Given the acute onset, monocular involvement, and time course, this is concerning for central retinal artery occlusion, though stroke remains in differential given acute onset and time-sensitive nature. Patient has cardiovascular risk factors and is within potential window for intervention.   - -Hold aspirin  and subQ heparin for the first 24 hours.  -Swallow evaluation at the bedside/Speech evaluation  -If failed swallow, insert NGT and start feeds w/i 48 hrs.  -Start statins  -Stroke work-up including labs (lipid profile, HbA1C, syphilis screening test and urine drug tests), 2D echo and MRI Brain W/O Contrast  -PT/OT/Speech/PMR  -Maintain euthermia, euvolemia, euglycemia.     - Ophthalmology consultation if vision does not improve or if thrombolytic therapy ineffective   - B-scan ultrasound of eye capability available   - Blood pressure monitoring with repeat cycling  Our recommendations are outlined below. Recommendations: IV Tenecteplase recommended.  I confirmed the following. (Patient name, DOB, MRN, Blood Pressure, dose of Thrombolytic and waste, weight completed by stretcher/scale not stated weight, have ED staff inform ED MD of  thrombolytic decision)  IV Tenecteplase Total Dose - 25.0 mg (Dose Rounding Per Facility Protocol)   Routine post Thrombolytic monitoring including neuro checks and blood pressure control during/after treatment Monitor blood pressure Check blood pressure and neuro assessment every 15 min for 2 h, then every 30 min for 6 h, and finally every hour for 16 h.  Manage Blood Pressure per post Thrombolytic protocol.        Follow designated hospital protocol for admission and post thrombolytic care       CT brain 24 hours post Thrombolytic       NPO until swallowing screen performed and passed       No antiplatelet agents or anticoagulants (including heparin for DVT prophylaxis) in first 24 hours       No Foley catheter, nasogastric tube, arterial catheter or central venous catheter for 24 hr, unless absolutely necessary       Telemetry       Bedside swallow evaluation       HOB less than 30 degrees       Euglycemia       Avoid hyperthermia, PRN acetaminophen       DVT prophylaxis       Inpatient Neurology Consultation       Stroke evaluation as per inpatient neurology recommendations  Discussed with ED physician    ------------------------------------------------------------------------------  Advanced Imaging: Advanced Imaging Deferred because:  Non-disabling symptoms as verified by the patient; no cortical signs so not consistent with LVO   Metrics: Last Known Well: 08/14/2024 00:00:00 Dispatch Time: 08/14/2024 01:14:25 Arrival Time: 08/14/2024 00:57:00 Initial Response Time: 08/14/2024 01:15:24 Symptoms: Right eye vision loss. Initial patient interaction: 08/14/2024 01:23:22 NIHSS Assessment Completed: 08/14/2024 01:30:48  Patient is a candidate for Thrombolytic. Thrombolytic Medical Decision: 08/14/2024 01:38:44 Needle Time: 08/14/2024 01:44:00 Weight Noted by Staff: 104 kg  CT Head: I personally reviewed all the CT images that were available to me and it showed: NO  acute findings  Primary Provider Notified of Diagnostic Impression and Management Plan on: 08/14/2024 01:55:51    ------------------------------------------------------------------------------  Extended thrombolytic management related to:     Awaiting family arrival before decision as per patient's request     More extensive discussion on the management decision was requested by patient or family  Thrombolytic Contraindications:  Last Known Well > 4.5 hours: No CT Head showing hemorrhage: No Ischemic stroke within 3 months: No Severe head trauma within 3 months: No Intracranial/intraspinal surgery within 3 months: No History of intracranial hemorrhage: No Symptoms and signs consistent with an SAH: No GI malignancy or GI bleed within 21 days: No Coagulopathy: Platelets <100 000 /mm3, INR >1.7, aPTT>40 s, or PT >15 s: No Treatment dose of LMWH within the previous 24 hrs: No Use of NOACs in past 48 hours: No Glycoprotein IIb/IIIa receptor inhibitors use: No Symptoms consistent with infective endocarditis: No Suspected aortic arch dissection: No Intra-axial intracranial neoplasm: No  Thrombolytic Decision and Management Plan: Management with thrombolytic treatment was explained to the Patient as was risks and benefits and alternatives to the treatment. Patient agrees with the decision to proceed with thrombolytic treatment. . All questions were answered and the Patient expressed understanding of the treatment plan.   History of Present Illness: Patient is a 73 year old Male.  Patient was brought by private transportation with symptoms of Right eye vision loss. The patient is a man with a medical history of coronary artery disease, hyperlipidemia, and hypertension who presents with complaints of sudden onset vision loss in the right eye that began about an hour ago around midnight.  The patient describes the vision loss as sudden onset, and reports that he can see a little bit  of light but everything appears mostly gray in the affected eye. The vision loss is monocular, affecting only the right eye, and he retains normal vision when the right eye is closed. He reports no other neurological deficits, including no weakness, slurring of speech, or drooling. 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   Past Medical History:      Hypertension      Hyperlipidemia      Coronary Artery Disease  Medications:  No Anticoagulant use  Antiplatelet use: Yes ASA 81 Reviewed EMR for current medications  Allergies:  Reviewed  Social History: Patient Is: Married Drug Use: No  Family History:  There is no family history of premature cerebrovascular disease pertinent to this consultation  ROS : 14 Points Review of Systems was performed and was negative except mentioned in HPI.  Past Surgical History: There Is No Surgical History Contributory To Today's Visit   Examination: BP(122/85), Pulse(94), 1A: Level of Consciousness - Alert; keenly responsive + 0 1B: Ask Month and Age - Both Questions Right + 0 1C: Blink Eyes & Squeeze Hands - Performs Both Tasks + 0 2: Test Horizontal Extraocular Movements - Normal + 0 3: Test Visual Fields - Partial Hemianopia + 1 4: Test Facial Palsy (Use Grimace if Obtunded) - Normal symmetry + 0 5A: Test Left Arm Motor Drift - No Drift for 10 Seconds + 0 5B: Test Right Arm Motor Drift - No Drift for 10 Seconds + 0 6A: Test Left Leg Motor Drift -  No Drift for 5 Seconds + 0 6B: Test Right Leg Motor Drift - No Drift for 5 Seconds + 0 7: Test Limb Ataxia (FNF/Heel-Shin) - No Ataxia + 0 8: Test Sensation - Normal; No sensory loss + 0 9: Test Language/Aphasia - Normal; No aphasia + 0 10: Test Dysarthria - Normal + 0 11: Test Extinction/Inattention - No abnormality + 0  NIHSS Score: 1 NIHSS Free Text : Right Eye Vision Loss  Pre-Morbid Modified Rankin Scale: 3 Points = Moderate disability; requiring some help, but  able to walk without assistance  Spoke with : Dr. Raford I reviewed the available imaging via Rapid and initiated discussion with the primary provider  This consult was conducted in real time using interactive audio and video technology. Patient was informed of the technology being used for this visit and agreed to proceed. Patient located in hospital and provider located at home/office setting.   Patient is being evaluated for possible acute neurologic impairment and high probability of imminent or life-threatening deterioration. I spent total of 45 minutes providing care to this patient, including time for face to face visit via telemedicine, review of medical records, imaging studies and discussion of findings with providers, the patient and/or family.     Dr Jaelen Gellerman   [facility] For Inpatient follow-up with [facility] physician please call [facility] at [phone]. As we are not an outpatient service for any post hospital discharge needs please contact the hospital for assistance.  If you have any questions for the [facility] physicians or need to reconsult for clinical or diagnostic changes please contact us  via [facility] at [phone].  Non-radiologist review of imaging performed to assist with emergent clinical decision-making. Remote physician workstations do not possess the same resolution, calibration, or diagnostic capabilities as hospital-based radiology reading stations, and formal radiologist read is necessary.   Signature : Beverlyn Mcginness

## 2024-08-14 NOTE — ED Notes (Signed)
 [facility] on-site. Report called to [facility]N.

## 2024-08-14 NOTE — Progress Notes (Signed)
 Echocardiogram 2D Echocardiogram has been performed.  Andriana Casa N Obaloluwa Delatte,RDCS 08/14/2024, 2:24 PM

## 2024-08-14 NOTE — Progress Notes (Signed)
 PT Cancellation Note  Patient Details Name: Rickey [name] MRN: [id number] DOB: 01-23-1951   Cancelled Treatment:    Reason Eval/Treat Not Completed: Other (comment)  Noted bedrest order has been discontinued. Noticed primary symptoms visual. Have notified OT that pt is off bedrest and await their assessment to see if PT evaluation is indicated.    Rickey [name], PT Acute Rehabilitation Services  Office [phone]  Rickey [name] 08/14/2024, 12:40 PM

## 2024-08-14 NOTE — ED Notes (Signed)
 Pt in CT with RN and on monitor.

## 2024-08-14 NOTE — Evaluation (Signed)
 Physical Therapy Evaluation Patient Details Name: Rickey [name] MRN: [id number] DOB: 22-Sep-1951 Today's Date: 08/14/2024  History of Present Illness  73 y.o. male with hx of CAD, HTN, HLD, with sudden onset right eye complete visual loss.  TNKase given, off bedrest.  CT Head: No evidence of  acute intracranial abnormality.  F/u MRI pending.  Clinical Impression  Pt admitted with above diagnosis. Lives at home with spouse, in a single-level home with a few steps to enter; Prior to admission, pt was independent, using a cane occasionally with walking, enjoys cycling; Presents to PT with mild, but present, gait unsteadiness;  Pt currently with functional limitations due to the deficits listed below (see PT Problem List). Pt will benefit from skilled PT to increase their independence and safety with mobility to allow discharge to the venue listed below.           If plan is discharge home, recommend the following: Assist for transportation   Can travel by private vehicle        Equipment Recommendations Other (comment) (will conintue to discern; may not need anything more than a cane)  Recommendations for Other Services       Functional Status Assessment Patient has had a recent decline in their functional status and demonstrates the ability to make significant improvements in function in a reasonable and predictable amount of time.     Precautions / Restrictions Precautions Precautions: Fall Recall of Precautions/Restrictions: Intact Restrictions Weight Bearing Restrictions Per Provider Order: No      Mobility  Bed Mobility Overal bed mobility: Modified Independent                  Transfers Overall transfer level: Needs assistance Equipment used: None Transfers: Sit to/from Stand Sit to Stand: Supervision           General transfer comment: mild unsteadiness noted at initial stand    Ambulation/Gait Ambulation/Gait assistance: Contact guard assist, Min  assist Gait Distance (Feet): 150 Feet Assistive device: None, IV Pole Gait Pattern/deviations: Step-through pattern, Staggering left, Staggering right       General Gait Details: slow, stiff steps; occasional loss of balance with erratic step width and mild unsteadiness; smoother steps when using IV pole to simulate using a cane  Stairs            Wheelchair Mobility     Tilt Bed    Modified Rankin (Stroke Patients Only) Modified Rankin (Stroke Patients Only) Pre-Morbid Rankin Score: No symptoms Modified Rankin: Moderate disability     Balance     Sitting balance-Leahy Scale: Good       Standing balance-Leahy Scale: Fair                               Pertinent Vitals/Pain Pain Assessment Pain Assessment: No/denies pain    Home Living Family/patient expects to be discharged to:: Private residence Living Arrangements: Spouse/significant other Available Help at Discharge: Family;Available 24 hours/day Type of Home: House Home Access: Stairs to enter Entrance Stairs-Rails: Right;Left;Can reach both Entrance Stairs-Number of Steps: 2   Home Layout: One level Home Equipment: Cane - single point;Shower seat Additional Comments: Wife uses 2WRW at baseline.  Patient uses SPC at times for stair managment.    Prior Function Prior Level of Function : Independent/Modified Independent;Driving                     Extremity/Trunk Assessment  Upper Extremity Assessment Upper Extremity Assessment: Defer to OT evaluation    Lower Extremity Assessment Lower Extremity Assessment: Overall WFL for tasks assessed (Mild unsteadiness at initial stand)    Cervical / Trunk Assessment Cervical / Trunk Assessment: Normal  Communication   Communication Communication: No apparent difficulties    Cognition Arousal: Alert Behavior During Therapy: Flat affect                             Following commands: Intact       Cueing        General Comments General comments (skin integrity, edema, etc.): Discussed modifiable adn non-modifiable risk factors for stroke    Exercises     Assessment/Plan    PT Assessment Patient needs continued PT services  PT Problem List Decreased coordination;Decreased balance       PT Treatment Interventions DME instruction;Gait training;Stair training;Functional mobility training;Therapeutic activities;Therapeutic exercise;Balance training;Neuromuscular re-education;Cognitive remediation;Patient/family education;Manual techniques    PT Goals (Current goals can be found in the Care Plan section)  Acute Rehab PT Goals Patient Stated Goal: home, back to physical acitivity PT Goal Formulation: With patient Time For Goal Achievement: 08/28/24 Potential to Achieve Goals: Good    Frequency Min 2X/week     Co-evaluation               AM-PAC PT 6 Clicks Mobility  Outcome Measure Help needed turning from your back to your side while in a flat bed without using bedrails?: None Help needed moving from lying on your back to sitting on the side of a flat bed without using bedrails?: None Help needed moving to and from a bed to a chair (including a wheelchair)?: None Help needed standing up from a chair using your arms (e.g., wheelchair or bedside chair)?: None Help needed to walk in hospital room?: None Help needed climbing 3-5 steps with a railing? : A Little 6 Click Score: 23    End of Session   Activity Tolerance: Patient tolerated treatment well Patient left: in bed;with call bell/phone within reach Nurse Communication: Mobility status PT Visit Diagnosis: Other symptoms and signs involving the nervous system (R29.898);Unsteadiness on feet (R26.81)    Time: 1544-1600 PT Time Calculation (min) (ACUTE ONLY): 16 min   Charges:   PT Evaluation $PT Eval Low Complexity: 1 Low   PT General Charges $$ ACUTE PT VISIT: 1 Visit         Silvano Currier, PT  Acute  Rehabilitation Services Office [phone] Secure Chat welcomed   Silvano VEAR Currier 08/14/2024, 6:17 PM

## 2024-08-14 NOTE — ED Notes (Signed)
 Returned from CT.

## 2024-08-14 NOTE — Evaluation (Signed)
 Occupational Therapy Evaluation Patient Details Name: Rickey [name] MRN: [id number] DOB: [birth year] Today's Date: 08/14/2024   History of Present Illness   73 y.o. male with hx of CAD, HTN, HLD, with sudden onset right eye complete visual loss.  TNKase given, off bedrest.  CT Head: No evidence of  acute intracranial abnormality.  F/u MRI pending.     Clinical Impressions Patient admitted for the diagnosis above.  PTA he lives at home with his spouse, and generally remains independent with ADL, iADL, and occasional use of SPC for stair management.  Patient performing at a supervision level, mild unsteadiness noted, could be combination of central vision impairment and lower extremity peripheral neuropathy.  OT will continue efforts in the acute setting, but no post acute OT is anticipated.       If plan is discharge home, recommend the following:   Assist for transportation     Functional Status Assessment   Patient has had a recent decline in their functional status and demonstrates the ability to make significant improvements in function in a reasonable and predictable amount of time.     Equipment Recommendations   None recommended by OT     Recommendations for Other Services         Precautions/Restrictions   Precautions Precautions: Fall Recall of Precautions/Restrictions: Intact Restrictions Weight Bearing Restrictions Per Provider Order: No     Mobility Bed Mobility Overal bed mobility: Modified Independent                  Transfers Overall transfer level: Needs assistance Equipment used: None Transfers: Sit to/from Stand, Bed to chair/wheelchair/BSC Sit to Stand: Supervision     Step pivot transfers: Supervision     General transfer comment: mild unsteadiness noted.      Balance Overall balance assessment: Needs assistance Sitting-balance support: Feet supported Sitting balance-Leahy Scale: Good     Standing balance support: No  upper extremity supported Standing balance-Leahy Scale: Fair                             ADL either performed or assessed with clinical judgement   ADL       Grooming: Supervision/safety       Lower Body Bathing: Consulting Civil Engineer Transfer: Supervision/safety                   Vision Ability to See in Adequate Light: 1 Impaired Patient Visual Report: Blurring of vision;Central vision impairment Additional Comments: Patient describes improving vision.  Center area of blurred vision to R eye - cannot make out details, but states peripheral vision is back to normal.     Perception Perception: Within Functional Limits       Praxis Praxis: [facility]       Pertinent Vitals/Pain Pain Assessment Pain Assessment: No/denies pain     Extremity/Trunk Assessment Upper Extremity Assessment Upper Extremity Assessment: Overall WFL for tasks assessed   Lower Extremity Assessment Lower Extremity Assessment: Defer to PT evaluation   Cervical / Trunk Assessment Cervical / Trunk Assessment: Normal   Communication Communication Communication: No apparent difficulties   Cognition Arousal: Alert Behavior During Therapy: Flat affect Cognition: No apparent impairments                               Following commands: Intact  Cueing  General Comments   Cueing Techniques: Verbal cues      Exercises     Shoulder Instructions      Home Living Family/patient expects to be discharged to:: Private residence Living Arrangements: Spouse/significant other Available Help at Discharge: Family;Available 24 hours/day Type of Home: House Home Access: Stairs to enter [employer] of Steps: 2 Entrance Stairs-Rails: Right;Left;Can reach both Home Layout: One level     Bathroom Shower/Tub: Chief Strategy Officer: Standard Bathroom Accessibility: Yes How Accessible: Accessible via walker Home Equipment:  Cane - single point;Shower seat   Additional Comments: Wife uses 2WRW at baseline.  Patient uses SPC at times for stair managment.  Lives With: Spouse    Prior Functioning/Environment Prior Level of Function : Independent/Modified Independent;Driving                    OT Problem List: Impaired balance (sitting and/or standing)   OT Treatment/Interventions: Self-care/ADL training;Therapeutic activities;Balance training      OT Goals(Current goals can be found in the care plan section)   Acute Rehab OT Goals Patient Stated Goal: Return home OT Goal Formulation: With patient Time For Goal Achievement: 08/29/24 Potential to Achieve Goals: Good ADL Goals Pt Will Perform Grooming: Independently;standing Pt Will Perform Lower Body Dressing: Independently;sit to/from stand Pt Will Transfer to Toilet: Independently;ambulating;regular height toilet   OT Frequency:  Min 2X/week    Co-evaluation              AM-PAC OT 6 Clicks Daily Activity     Outcome Measure Help from another person eating meals?: None Help from another person taking care of personal grooming?: A Little Help from another person toileting, which includes using toliet, bedpan, or urinal?: A Little Help from another person bathing (including washing, rinsing, drying)?: A Little Help from another person to put on and taking off regular upper body clothing?: None Help from another person to put on and taking off regular lower body clothing?: A Little 6 Click Score: 20   End of Session Nurse Communication: Mobility status  Activity Tolerance: Patient tolerated treatment well Patient left: in chair;with call bell/phone within reach;with chair alarm set  OT Visit Diagnosis: Unsteadiness on feet (R26.81);Low vision, both eyes (H54.2)                Time: 8684-8662 OT Time Calculation (min): 22 min Charges:  OT General Charges $OT Visit: 1 Visit OT Evaluation $OT Eval Moderate Complexity: 1  Mod  08/14/2024  RP, OTR/L  Acute Rehabilitation Services  Office:  [phone]   Charlie JONETTA Halsted 08/14/2024, 1:50 PM

## 2024-08-15 ENCOUNTER — Other Ambulatory Visit: Payer: Self-pay | Admitting: Neurology

## 2024-08-15 ENCOUNTER — Inpatient Hospital Stay (HOSPITAL_COMMUNITY)

## 2024-08-15 ENCOUNTER — Other Ambulatory Visit (HOSPITAL_COMMUNITY): Payer: Self-pay

## 2024-08-15 DIAGNOSIS — H3411 Central retinal artery occlusion, right eye: Secondary | ICD-10-CM

## 2024-08-15 LAB — BASIC METABOLIC PANEL WITH GFR
Anion gap: 8 (ref 5–15)
BUN: 15 mg/dL (ref 8–23)
CO2: 28 mmol/L (ref 22–32)
Calcium: 8.9 mg/dL (ref 8.9–10.3)
Chloride: 103 mmol/L (ref 98–111)
Creatinine, Ser: 0.88 mg/dL (ref 0.61–1.24)
GFR, Estimated: >60 mL/min (ref >60)
Glucose, Bld: 98 mg/dL (ref 70–99)
Potassium: 4 mmol/L (ref 3.5–5.1)
Sodium: 139 mmol/L (ref 135–145)

## 2024-08-15 MED ORDER — ATORVASTATIN CALCIUM 40 MG PO TABS
40.0000 mg | ORAL_TABLET | Freq: Every day | ORAL | 1 refills | Status: DC
  Filled 2024-08-15: qty 30, 30d supply, fill #0

## 2024-08-15 MED ORDER — CLOPIDOGREL BISULFATE 75 MG PO TABS
75.0000 mg | ORAL_TABLET | Freq: Every day | ORAL | Status: DC
Start: 2024-08-15 — End: 2024-08-15

## 2024-08-15 MED ORDER — ASPIRIN 81 MG PO TBEC: 81.0000 mg | DELAYED_RELEASE_TABLET | Freq: Every day | ORAL | Status: DC

## 2024-08-15 MED ORDER — GABAPENTIN 100 MG PO CAPS
200.0000 mg | ORAL_CAPSULE | Freq: Two times a day (BID) | ORAL | 1 refills | Status: DC
  Filled 2024-08-15: qty 60, 15d supply, fill #0

## 2024-08-15 MED ORDER — ASPIRIN 81 MG PO TBEC
81.0000 mg | DELAYED_RELEASE_TABLET | Freq: Every day | ORAL | 12 refills | Status: DC
  Filled 2024-08-15: qty 30, 30d supply, fill #0

## 2024-08-15 MED ORDER — ASPIRIN 81 MG PO TBEC
81.0000 mg | DELAYED_RELEASE_TABLET | Freq: Every day | ORAL | Status: DC
  Administered 2024-08-15: 81 mg via ORAL
  Filled 2024-08-15: qty 1

## 2024-08-15 MED ORDER — SODIUM CHLORIDE 0.9 % IV BOLUS
1000.0000 mL | Freq: Once | INTRAVENOUS | Status: AC
  Administered 2024-08-15: 1000 mL via INTRAVENOUS

## 2024-08-15 MED ORDER — CLOPIDOGREL BISULFATE 75 MG PO TABS
75.0000 mg | ORAL_TABLET | Freq: Every day | ORAL | 1 refills | Status: DC
  Filled 2024-08-15: qty 30, 30d supply, fill #0

## 2024-08-15 MED ORDER — CLOPIDOGREL BISULFATE 75 MG PO TABS
75.0000 mg | ORAL_TABLET | Freq: Every day | ORAL | Status: DC
  Administered 2024-08-15: 75 mg via ORAL
  Filled 2024-08-15: qty 1

## 2024-08-15 NOTE — Progress Notes (Signed)
   08/15/24 0604  Vitals  Temp Source  (Notified stroke MD for low BP. 1 L bolus ordered as BP keeps dropping when pt sleeps.)  BP (!) 84/47  MAP (mmHg) (!) 58  Pulse Rate 64  ECG Heart Rate 69  Resp 16  Oxygen Therapy  SpO2 91 %

## 2024-08-15 NOTE — Discharge Instructions (Signed)
 DISCHARGE PLAN Disposition: Home aspirin  81 mg daily and clopidogrel 75 mg daily for secondary stroke prevention for 3 weeks then clopidogrel 75 mg daily alone. Ongoing stroke risk factor control by Primary Care Physician at time of discharge Follow-up PCP Arvid Collar, FNP or call [facility] to get established  Follow up with ophthalmology- [facility] Follow-up in [facility] in 8 weeks, office to schedule an appointment. Able to see NP in clinic.

## 2024-08-15 NOTE — Progress Notes (Signed)
 PT Cancellation Note  Patient Details Name: Rickey [name] MRN: [id number] DOB: 02-Aug-1951   Cancelled Treatment:    Reason Eval/Treat Not Completed: Patient declined, no reason specified - pt states there is no point to have PT session, declines need to practice stairs or gait training prior to d/c.   Yanil Dawe S, PT DPT Acute Rehabilitation Services Secure Chat Preferred  Office [phone]    Rickey [name] 08/15/2024, 12:20 PM

## 2024-08-15 NOTE — Discharge Summary (Addendum)
 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   aspirin  EC 81 MG tablet Take 1 tablet (81 mg total) by mouth daily. Swallow whole. Start taking on: August 16, 2024   atorvastatin  40 MG tablet Commonly known as: LIPITOR Take 1 tablet (40 mg total) by mouth daily with supper.   clopidogrel 75 MG tablet Commonly known as: PLAVIX Take 1 tablet (75 mg total) by mouth daily. Start taking on: August 16, 2024   gabapentin 100 MG capsule Commonly known as: NEURONTIN Take 2 capsules (200 mg total) by mouth 2 (two) times daily. What changed:  medication strength how much to take   lisinopril  10 MG tablet Commonly known as: ZESTRIL  TAKE 1 TABLET BY MOUTH ONCE DAILY . APPOINTMENT REQUIRED FOR FUTURE REFILLS What changed: See the new instructions.        LABORATORY STUDIES CBC    Component Value Date/Time   WBC 6.8 08/14/2024 0057   RBC 5.24 08/14/2024 0057   HGB 15.2 08/14/2024 0057   HCT 45.7 08/14/2024 0057   PLT 232 08/14/2024 0057   MCV 87.2 08/14/2024 0057   MCH 29.0 08/14/2024 0057   MCHC 33.3 08/14/2024 0057   RDW 13.7 08/14/2024 0057   LYMPHSABS 1.5 08/14/2024 0057   MONOABS 0.9 08/14/2024 0057   EOSABS 0.2 08/14/2024 0057   BASOSABS 0.1 08/14/2024 0057   CMP    Component Value  Date/Time   NA 138 08/14/2024 0057   NA 140 04/06/2020 1154   K 3.7 08/14/2024 0057   CL 98 08/14/2024 0057   CO2 26 08/14/2024 0057   GLUCOSE 110 (H) 08/14/2024 0057   BUN 27 (H) 08/14/2024 0057   BUN 12 04/06/2020 1154   CREATININE 1.34 (H) 08/14/2024 0057   CALCIUM  9.8 08/14/2024 0057   PROT 8.0 08/14/2024 0057   ALBUMIN 4.7 08/14/2024 0057   AST 32 08/14/2024 0057   ALT 15 08/14/2024 0057   ALKPHOS 67 08/14/2024 0057   BILITOT 0.5 08/14/2024 0057   GFRNONAA 56 (L) 08/14/2024 0057   GFRAA 100 04/06/2020 1154   COAGS Lab Results  Component Value Date   INR 1.0 08/14/2024   Lipid Panel    Component Value Date/Time   CHOL 175 08/14/2024 0423   CHOL 144 04/06/2020 1154   TRIG 72 08/14/2024 0423   HDL 39 (L) 08/14/2024 0423   HDL 36 (L) 04/06/2020 1154   CHOLHDL 4.5 08/14/2024 0423   VLDL 14 08/14/2024 0423   LDLCALC 122 (H) 08/14/2024 0423   LDLCALC 94 04/06/2020 1154   HgbA1C  Lab Results  Component Value Date   HGBA1C 5.3 08/14/2024    SIGNIFICANT DIAGNOSTIC STUDIES MR BRAIN WO  CONTRAST Result Date: 08/15/2024 EXAM: MRI BRAIN WITHOUT CONTRAST 08/15/2024 02:34:11 AM TECHNIQUE: Multiplanar multisequence MRI of the head/brain was performed without the administration of intravenous contrast. COMPARISON: CT head 08/14/2024. MRI head 01/03/2021. CLINICAL HISTORY: Neuro deficit, acute, stroke suspected FINDINGS: BRAIN AND VENTRICLES: No acute infarct. No intracranial hemorrhage. No mass. No midline shift. No hydrocephalus. The sella is unremarkable. Normal flow voids. ORBITS: No acute abnormality. SINUSES AND MASTOIDS: No acute abnormality. BONES AND SOFT TISSUES: Normal marrow signal. No acute soft tissue abnormality. IMPRESSION: 1. No acute intracranial abnormality. Electronically signed by: Gilmore Molt MD 08/15/2024 02:42 AM EST RP Workstation: [id number]   ECHOCARDIOGRAM COMPLETE Result Date: 08/14/2024    ECHOCARDIOGRAM REPORT   Patient Name:   Rickey [name] Date  of Exam: 08/14/2024 Medical Rec #:  [id number]     Height:       66.0 in Accession #:    [id number]    Weight:       230.2 lb Date of Birth:  [birth year]     BSA:          2.123 m Patient Age:    73 years      BP:           115/74 mmHg Patient Gender: M             HR:           71 bpm. Exam Location:  Inpatient Procedure: 2D Echo, 3D Echo, Cardiac Doppler, Color Doppler and Strain Analysis            (Both Spectral and Color Flow Doppler were utilized during            procedure). Indications:    Stroke  History:        Patient has prior history of Echocardiogram examinations, most                 recent 04/04/2020. CAD and Previous Myocardial Infarction; Risk                 Factors:Hypertension and Dyslipidemia.  Sonographer:    Logan Shove RDCS Referring Phys: [id number] [facility] IMPRESSIONS  1. Left ventricular ejection fraction, by estimation, is 60 to 65%. The left ventricle has normal function. The left ventricle has no regional wall motion abnormalities. There is mild left ventricular hypertrophy. Left ventricular diastolic parameters are indeterminate. The average left ventricular global longitudinal strain is -19.4 %. The global longitudinal strain is normal.  2. Right ventricular systolic function is normal. The right ventricular size is normal.  3. The mitral valve is normal in structure. No evidence of mitral valve regurgitation. No evidence of mitral stenosis.  4. The aortic valve is tricuspid. There is mild calcification of the aortic valve. There is mild thickening of the aortic valve. Aortic valve regurgitation is not visualized. No aortic stenosis is present.  5. The inferior vena cava is normal in size with greater than 50% respiratory variability, suggesting right atrial pressure of 3 mmHg. FINDINGS  Left Ventricle: Left ventricular ejection fraction, by estimation, is 60 to 65%. The left ventricle has normal function. The left ventricle has no regional wall motion abnormalities. The average  left ventricular global longitudinal strain is -19.4 %. Strain was performed and the global longitudinal strain is normal. The left ventricular internal cavity size was normal in size. There is mild left ventricular hypertrophy. Left ventricular diastolic parameters are indeterminate. Right Ventricle: The right ventricular size is normal. Right vetricular wall thickness was  not well visualized. Right ventricular systolic function is normal. Left Atrium: Left atrial size was normal in size. Right Atrium: Right atrial size was normal in size. Pericardium: There is no evidence of pericardial effusion. Mitral Valve: The mitral valve is normal in structure. No evidence of mitral valve regurgitation. No evidence of mitral valve stenosis. Tricuspid Valve: The tricuspid valve is normal in structure. Tricuspid valve regurgitation is not demonstrated. No evidence of tricuspid stenosis. Aortic Valve: The aortic valve is tricuspid. There is mild calcification of the aortic valve. There is mild thickening of the aortic valve. There is mild aortic valve annular calcification. Aortic valve regurgitation is not visualized. No aortic stenosis  is present. Aortic valve mean gradient measures 6.5 mmHg. Aortic valve peak gradient measures 12.2 mmHg. Aortic valve area, by VTI measures 1.82 cm. Pulmonic Valve: The pulmonic valve was not well visualized. Pulmonic valve regurgitation is not visualized. No evidence of pulmonic stenosis. Aorta: The aortic root and ascending aorta are structurally normal, with no evidence of dilitation. Venous: The inferior vena cava is normal in size with greater than 50% respiratory variability, suggesting right atrial pressure of 3 mmHg. IAS/Shunts: No atrial level shunt detected by color flow Doppler. Additional Comments: 3D was performed not requiring image post processing on an independent workstation and was normal.  LEFT VENTRICLE PLAX 2D LVIDd:         4.50 cm   Diastology LVIDs:         3.00 cm    LV e' medial:    6.20 cm/s LV PW:         1.30 cm   LV E/e' medial:  11.2 LV IVS:        1.10 cm   LV e' lateral:   12.50 cm/s LVOT diam:     2.00 cm   LV E/e' lateral: 5.6 LV SV:         66 LV SV Index:   31        2D Longitudinal Strain LVOT Area:     3.14 cm  2D Strain GLS Avg:     -19.4 % LV IVRT:       292 msec                           3D Volume EF:                          3D EF:        59 %                          LV EDV:       130 ml                          LV ESV:       54 ml                          LV SV:        76 ml RIGHT VENTRICLE             IVC RV Basal diam:  2.70 cm     IVC diam: 1.90 cm RV S prime:     15.40 cm/s TAPSE (M-mode): 1.9 cm      PULMONARY VEINS  Diastolic Velocity: 24.60 cm/s                             S/D Velocity:       2.30                             Systolic Velocity:  56.60 cm/s LEFT ATRIUM             Index        RIGHT ATRIUM           Index LA diam:        4.70 cm 2.21 cm/m   RA Area:     16.30 cm LA Vol (A2C):   61.1 ml 28.78 ml/m  RA Volume:   43.50 ml  20.49 ml/m LA Vol (A4C):   50.9 ml 23.98 ml/m LA Biplane Vol: 57.9 ml 27.28 ml/m  AORTIC VALVE AV Area (Vmax):    1.76 cm AV Area (Vmean):   1.75 cm AV Area (VTI):     1.82 cm AV Vmax:           174.50 cm/s AV Vmean:          116.500 cm/s AV VTI:            0.361 m AV Peak Grad:      12.2 mmHg AV Mean Grad:      6.5 mmHg LVOT Vmax:         97.50 cm/s LVOT Vmean:        65.000 cm/s LVOT VTI:          0.209 m LVOT/AV VTI ratio: 0.58  AORTA Ao Root diam: 2.90 cm Ao Asc diam:  3.60 cm MITRAL VALVE MV Area (PHT): 3.21 cm    SHUNTS MV Decel Time: 236 msec    Systemic VTI:  0.21 m MV E velocity: 69.70 cm/s  Systemic Diam: 2.00 cm MV A velocity: 75.50 cm/s MV E/A ratio:  0.92 Dorn Ross MD Electronically signed by Dorn Ross MD Signature Date/Time: 08/14/2024/2:57:49 PM    Final    CT ANGIO HEAD NECK W WO CM Result Date: 08/14/2024 EXAM: CTA HEAD AND NECK WITHOUT AND WITH  08/14/2024 02:30:00 AM TECHNIQUE: CTA of the head and neck was performed without and with the administration of intravenous contrast. Multiplanar 2D and/or 3D reformatted images are provided for review. Automated exposure control, iterative reconstruction, and/or weight based adjustment of the mA/kV was utilized to reduce the radiation dose to as low as reasonably achievable. Stenosis of the internal carotid arteries measured using NASCET criteria. COMPARISON: Same day CT head. CLINICAL HISTORY: Neuro deficit, acute, stroke suspected FINDINGS: AORTIC ARCH AND ARCH VESSELS: Aberrant origin of the right subclavian artery, anatomic variant. No significant stenosis of the brachiocephalic or subclavian arteries. CERVICAL CAROTID ARTERIES: No dissection, arterial injury, or hemodynamically significant stenosis by NASCET criteria. CERVICAL VERTEBRAL ARTERIES: No dissection, arterial injury, or significant stenosis. LUNGS AND MEDIASTINUM: Unremarkable. SOFT TISSUES: No acute abnormality. BONES: No acute abnormality. ANTERIOR CIRCULATION: No significant stenosis of the internal carotid arteries. No significant stenosis of the anterior cerebral arteries. No significant stenosis of the middle cerebral arteries. No aneurysm. POSTERIOR CIRCULATION: No significant stenosis of the posterior cerebral arteries. No significant stenosis of the basilar artery. No significant stenosis of the vertebral arteries. No aneurysm. OTHER: No dural venous sinus thrombosis on this non-dedicated study. IMPRESSION: 1. No large vessel occlusion  or hemodynamically significant stenosis. Electronically signed by: Gilmore Molt MD 08/14/2024 02:37 AM EST RP Workstation: [id number]   CT HEAD CODE STROKE WO CONTRAST` Result Date: 08/14/2024 EXAM: CT HEAD WITHOUT CONTRAST 08/14/2024 01:25:00 AM TECHNIQUE: CT of the head was performed without the administration of intravenous contrast. Automated exposure control, iterative reconstruction, and/or weight  based adjustment of the mA/kV was utilized to reduce the radiation dose to as low as reasonably achievable. COMPARISON: None available. CLINICAL HISTORY: Neuro deficit, acute, stroke suspected. FINDINGS: BRAIN AND VENTRICLES: No acute hemorrhage. No evidence of acute infarct. No hydrocephalus. No extra-axial collection. No mass effect or midline shift. ORBITS: No acute abnormality. SINUSES: No acute abnormality. SOFT TISSUES AND SKULL: No skull fracture. Findings discussed with Dr. Raford via telephone at 1:34 AM. IMPRESSION: 1. No evidence of  acute intracranial abnormality. Electronically signed by: Gilmore Molt MD 08/14/2024 01:35 AM EST RP Workstation: [id number]       HISTORY OF PRESENT ILLNESS 73 y.o. patient with history of chronic pain, AKI, constipation was admitted with episode of sudden onset painless right eye complete vision loss concerning for CRAO.  He presented to [facility] ED where he was given TNKase he was transferred to [facility] for post [facility] monitoring.  NIH on Admission 1.   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  CTA head & neck: No large vessel occlusion or hemodynamically significant stenosis.  MRI No acute intracranial abnormality 2D Echo EF 60 to 65% LDL 122 HgbA1c 5.3 UDS positive for THC VTE prophylaxis -SCDs aspirin  81 mg daily prior to admission, now on aspirin  81mg  and plavix 75mg  for 21 days and then plavix alone  Therapy recommendations: None Disposition: Pending   Hypertension Home meds:  Lisinopril  10mg  Stable Blood Pressure Goal: BP less than 180/105  Long-term BP goal normotensive   Hyperlipidemia Home meds: none LDL 122, goal < 70 Add Lipitor 40mg    Continue statin at discharge   Marijuana abuse Patient uses marijuana UDS positive for  THC        Ready to quit? Yes Cessation education provided   Other Stroke Risk Factors Obesity, Body mass index is  37.15 kg/m., BMI >/= 30 associated with increased stroke risk, recommend weight loss, diet and exercise as appropriate  CAD   Other Active Problems Chronic Pain PTA: Gabapenitn 400mg  BID--restarted at 200mg  AKI, CR 1.34 Was on IVF for 6 hours Encourage p.o. intake Hx of constipation PTA: Linzess  Senna daily, Dulcolax suppository PRN  DISCHARGE EXAM  PHYSICAL EXAM General:  Alert, well-nourished, well-developed patient in no acute distress Psych:  Mood and affect appropriate for situation CV: Regular rate and rhythm on monitor Respiratory:  Regular, unlabored respirations on room air GI: Abdomen soft and nontender   NEURO:  Mental Status: AA&Ox3, patient is able to give clear and coherent history Speech/Language: No aphasia.  Naming, repetition, fluency, and comprehension intact.   Cranial Nerves:  II: PERRL. Small area of central vision loss in right eye, peripheral vision fully intact.  III, IV, VI: EOMI. Tracking bilaterally. Eyelids elevate symmetrically.  V: Sensation is intact to light touch and symmetrical to face.  VII: Face is symmetrical resting and smiling VIII: hearing intact to voice. IX, X: Palate elevates symmetrically. No dysarthria.  KP:Dynloizm shrug 5/5. XII: tongue is midline without fasciculations. Motor: 5/5 strength to all muscle groups tested.  Tone: is normal and bulk is normal Sensation- Intact to light touch bilaterally.  Coordination: FTN intact bilaterally.  Gait- deferred  1a Level of Conscious.: 0 1b LOC Questions: 0 1c LOC Commands: 0 2 Best Gaze: 0 3 Visual: 0 4 Facial Palsy: 0 5a Motor Arm - left: 0 5b Motor Arm - Right: 0 6a Motor Leg - Left: 0 6b Motor Leg - Right: 0 7 Limb Ataxia: 0 8 Sensory: 0 9 Best Language: 0 10 Dysarthria: 0 11 Extinct. and Inatten.: 0 TOTAL: 0   Discharge Diet       Diet   Diet Heart Room service appropriate? Yes; Fluid consistency: Thin   liquids  DISCHARGE PLAN Disposition: Home aspirin   81 mg daily and clopidogrel 75 mg daily for secondary stroke prevention for 3 weeks then clopidogrel 75 mg daily alone. Ongoing stroke risk factor control by Primary Care Physician at time of discharge Follow-up PCP Arvid Collar, FNP or call [facility] to get established  Follow up with ophthalmology- [facility] Follow-up in [facility] in 4-6 weeks, office to schedule an appointment. Able to see NP in clinic.  35 minutes were spent preparing discharge.  Patient seen and examined by NP/APP with MD. MD to update note as needed.   Jorene Last, DNP, FNP-BC [facility] Pager: [phone]  ATTENDING NOTE: I reviewed above note and agree with the assessment and plan. Pt was seen and examined.   No acute event overnight.  Patient lying in bed, no family at bedside.  MRI overnight showed no acute infarct.  Patient still has small area of right central vision loss but seems improved a little bit from yesterday.  Recommend outpatient ophthalmology follow-up.  Continue DAPT for 3 weeks and then Plavix alone.  Continue statin.  Follow-up with GI in 4 to 6 weeks.  For detailed assessment and plan, please refer to above as I have made changes wherever appropriate.   Ary Cummins, MD PhD Stroke Neurology 08/15/2024 5:32 PM

## 2024-08-25 ENCOUNTER — Encounter: Payer: Self-pay | Admitting: Family

## 2024-08-25 ENCOUNTER — Ambulatory Visit (INDEPENDENT_AMBULATORY_CARE_PROVIDER_SITE_OTHER): Admitting: Family

## 2024-08-25 VITALS — BP 114/78 | HR 85 | Temp 98.7°F | Ht 66.0 in | Wt 225.4 lb

## 2024-08-25 DIAGNOSIS — I2510 Atherosclerotic heart disease of native coronary artery without angina pectoris: Secondary | ICD-10-CM

## 2024-08-25 DIAGNOSIS — Z7689 Persons encountering health services in other specified circumstances: Secondary | ICD-10-CM | POA: Diagnosis not present

## 2024-08-25 DIAGNOSIS — E782 Mixed hyperlipidemia: Secondary | ICD-10-CM

## 2024-08-25 DIAGNOSIS — Z8673 Personal history of transient ischemic attack (TIA), and cerebral infarction without residual deficits: Secondary | ICD-10-CM

## 2024-08-25 DIAGNOSIS — I10 Essential (primary) hypertension: Secondary | ICD-10-CM | POA: Diagnosis not present

## 2024-08-25 DIAGNOSIS — G609 Hereditary and idiopathic neuropathy, unspecified: Secondary | ICD-10-CM

## 2024-09-01 DIAGNOSIS — G609 Hereditary and idiopathic neuropathy, unspecified: Secondary | ICD-10-CM | POA: Insufficient documentation

## 2024-09-01 DIAGNOSIS — Z8673 Personal history of transient ischemic attack (TIA), and cerebral infarction without residual deficits: Secondary | ICD-10-CM | POA: Insufficient documentation
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
## 2024-09-12 ENCOUNTER — Other Ambulatory Visit: Payer: Self-pay | Admitting: Family

## 2024-09-12 MED ORDER — CLOPIDOGREL BISULFATE 75 MG PO TABS: 75.0000 mg | ORAL_TABLET | Freq: Every day | ORAL | 1 refills | Status: DC

## 2024-09-12 NOTE — Telephone Encounter (Signed)
 Copied from CRM [id number]. Topic: Clinical - Medication Refill >> Sep 12, 2024 11:33 AM Chiquita SQUIBB wrote: Medication: clopidogrel  (PLAVIX ) 75 MG tablet [[id number]]  Has the patient contacted their pharmacy? Yes (Agent: If no, request that the patient contact the pharmacy for the refill. If patient does not wish to contact the pharmacy document the reason why and proceed with request.) (Agent: If yes, when and what did the pharmacy advise?)  This is the patient's preferred pharmacy:   [facility] [address] ([address] [place] - [address] [place] ([place]) [place] [address] Phone: [phone] Fax: [phone]  Is this the correct pharmacy for this prescription? Yes If no, delete pharmacy and type the correct one.   Has the prescription been filled recently? No  Is the patient out of the medication? No  Has the patient been seen for an appointment in the last year OR does the patient have an upcoming appointment? Yes  Can we respond through MyChart? No  Agent: Please be advised that Rx refills may take up to 3 business days. We ask that you follow-up with your pharmacy.

## 2024-09-13 ENCOUNTER — Other Ambulatory Visit: Payer: Self-pay | Admitting: Family

## 2024-09-13 NOTE — Telephone Encounter (Unsigned)
 Copied from CRM #[id number]. Topic: Clinical - Medication Refill >> Sep 13, 2024 12:49 PM Suzette B wrote: Medication: atorvastatin  (LIPITOR) 40 MG tablet  30 tablets   Has the patient contacted their pharmacy? Yes pt stated they don't have it on file being he just started at this clinic   [facility] [address] ([address]), [place] - [address] [place] ([place]) [place] [address] Phone: [phone] Fax: [phone]  Is this the correct pharmacy for this prescription? Yes If no, delete pharmacy and type the correct one.   Has the prescription been filled recently? Yes  Is the patient out of the medication? No-4 days left  Has the patient been seen for an appointment in the last year OR does the patient have an upcoming appointment? Yes 08/25/24  Can we respond through MyChart? No  Agent: Please be advised that Rx refills may take up to 3 business days. We ask that you follow-up with your pharmacy.

## 2024-09-13 NOTE — Telephone Encounter (Signed)
Never prescribed by you.

## 2024-09-13 NOTE — Progress Notes (Unsigned)
 [facility] [address] [place]. [place] [address] [phone]       HOSPITAL FOLLOW UP NOTE  Mr. [name] Date of Birth:  Dec 15, 1950 Medical Record Number:  [id number]   Reason for Referral:  hospital stroke follow up    SUBJECTIVE:   CHIEF COMPLAINT:  No chief complaint on file.   HPI:   Rickey [name] is a 73 y.o. patient with history of chronic pain, CAD, HTN, AKI, constipation who presented on 08/14/2024 with episode of sudden onset painless right eye complete vision loss concerning for CRAO.  He presented to [facility] ED where he was given TNKase , he was transferred to [facility] for post TNKase  monitoring.  CTA head/neck negative LVO, MRI no acute abnormality.  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  EF 60 to 65%.  LDL 122.  A1c 5.3.  Recommended DAPT for 3 weeks then Plavix  alone and initiated atorvastatin  40 mg daily.  UDS positive for THC, cessation counseling provided.  Persistent small area of right central vision loss with some improvement and recommended outpatient ophthalmology follow-up.        PERTINENT IMAGING  Code Stroke CT head No acute abnormality.  CTA head & neck: No large vessel occlusion or hemodynamically significant stenosis.  MRI No acute intracranial abnormality 2D Echo EF 60 to 65% LDL 122 HgbA1c 5.3 UDS positive for THC    ROS:   14 system review of systems performed and negative with exception of ***  PMH:  Past Medical History:  Diagnosis Date   CAD (coronary artery disease)    Hyperlipidemia    Hypertension     PSH:  Past Surgical History:  Procedure Laterality Date   CORONARY STENT PLACEMENT     2012    Social History:  Social History   Socioeconomic History   Marital status: Single    Spouse name: Not on file   Number of children: 1   Years of education: Not on file   Highest education level: 12th grade  Occupational History    Not on file  Tobacco Use   Smoking status: Former    Current packs/day: 1.00    Average packs/day: 1 pack/day for 50.9 years (50.9 ttl pk-yrs)    Types: Cigarettes    Start date: 17   Smokeless tobacco: Never  Vaping Use   Vaping status: Never Used  Substance and Sexual Activity   Alcohol use: Yes    Comment: occ   Drug use: Yes    Types: Marijuana   Sexual activity: Not on file  Other Topics Concern   Not on file  Social History Narrative   Not on file   Social Drivers of Health   Financial Resource Strain: Patient Declined (08/24/2024)   Overall Financial Resource Strain (CARDIA)    Difficulty of Paying Living Expenses: Patient declined  Food Insecurity: No Food Insecurity (08/24/2024)   Hunger Vital Sign    Worried About Running Out of Food in the Last Year: Never true    Ran Out of Food in the Last Year: Never true  Transportation Needs: No Transportation Needs (08/24/2024)   PRAPARE - Administrator, Civil Service (Medical): No    Lack of Transportation (Non-Medical): No  Physical Activity: Sufficiently Active (08/24/2024)   Exercise Vital Sign    Days of Exercise per Week: 6 days    Minutes of Exercise per Session: 60 min  Stress: No Stress  Concern Present (08/24/2024)   [employer] of Occupational Health - Occupational Stress Questionnaire    Feeling of Stress: Not at all  Social Connections: Unknown (08/24/2024)   Social Connection and Isolation Panel    Frequency of Communication with Friends and Family: Patient declined    Frequency of Social Gatherings with Friends and Family: Twice a week    Attends Religious Services: Never    Database Administrator or Organizations: No    Attends Engineer, Structural: Not on file    Marital Status: Living with partner  Intimate Partner Violence: Not At Risk (08/15/2024)   Humiliation, Afraid, Rape, and Kick questionnaire    Fear of Current or Ex-Partner: No    Emotionally Abused: No     Physically Abused: No    Sexually Abused: No    Family History:  Family History  Problem Relation Age of Onset   Diabetes Mother    Cancer Father    Stroke Father    Kidney disease Brother    Hypertension Brother    Heart attack Brother     Medications:   Current Outpatient Medications on File Prior to Visit  Medication Sig Dispense Refill   aspirin  EC 81 MG tablet Take 1 tablet (81 mg total) by mouth daily. Swallow whole. 30 tablet 12   atorvastatin  (LIPITOR) 40 MG tablet Take 1 tablet (40 mg total) by mouth daily with supper. 30 tablet 1   clopidogrel  (PLAVIX ) 75 MG tablet Take 1 tablet (75 mg total) by mouth daily. 30 tablet 1   ezetimibe (ZETIA) 10 MG tablet Take 10 mg by mouth daily.     gabapentin  (NEURONTIN ) 100 MG capsule Take 2 capsules (200 mg total) by mouth 2 (two) times daily. 60 capsule 1   hydrochlorothiazide (HYDRODIURIL) 12.5 MG tablet Take 12.5 mg by mouth daily.     lisinopril  (ZESTRIL ) 10 MG tablet TAKE 1 TABLET BY MOUTH ONCE DAILY . APPOINTMENT REQUIRED FOR FUTURE REFILLS 90 tablet 0   Multiple Vitamins-Minerals (COMPLETE MULTIVITAMIN/MINERAL PO) Take 1 tablet by mouth daily.     No current facility-administered medications on file prior to visit.    Allergies:   Allergies  Allergen Reactions   Penicillins       OBJECTIVE:  Physical Exam  There were no vitals filed for this visit. There is no height or weight on file to calculate BMI. No results found.   General: well developed, well nourished, seated, in no evident distress Head: head normocephalic and atraumatic.   Neck: supple with no carotid or supraclavicular bruits Cardiovascular: regular rate and rhythm, no murmurs Musculoskeletal: no deformity Skin:  no rash/petichiae Vascular:  Normal pulses all extremities   Neurologic Exam Mental Status: Awake and fully alert. Oriented to place and time. Recent and remote memory intact. Attention span, concentration and fund of knowledge  appropriate. Mood and affect appropriate.  Cranial Nerves: Fundoscopic exam reveals sharp disc margins. Pupils equal, briskly reactive to light. Extraocular movements full without nystagmus. Visual fields full to confrontation. Hearing intact. Facial sensation intact. Face, tongue, palate moves normally and symmetrically.  Motor: Normal bulk and tone. Normal strength in all tested extremity muscles Sensory.: intact to touch , pinprick , position and vibratory sensation.  Coordination: Rapid alternating movements normal in all extremities. Finger-to-nose and heel-to-shin performed accurately bilaterally. Gait and Station: Arises from chair without difficulty. Stance is normal. Gait demonstrates normal stride length and balance with ***. Tandem walk and heel toe ***.  Reflexes: 1+ and  symmetric. Toes downgoing.     NIHSS  *** Modified Rankin  ***      ASSESSMENT: Rickey [name] is a 73 y.o. 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 Vascular risk factors include HTN, HLD, THC use, obesity and CAD.      PLAN:  R CRAO :  Residual deficit: ***.  Continue Plavix  and atorvastatin  (Lipitor) for secondary stroke prevention managed/prescribed by PCP.   Discussed secondary stroke prevention measures and importance of close PCP follow up for aggressive stroke risk factor management including BP goal<130/90, HLD with LDL goal<70 and DM with A1c.<7 .  Stroke labs 08/2024: LDL 122, A1c 5.3 I have gone over the pathophysiology of stroke, warning signs and symptoms, risk factors and their management in some detail with instructions to go to the closest emergency room for symptoms of concern. THC use:     Follow up in *** or call earlier if needed   CC:  [facility] provider: Dr. Rosemarie PCP: Ngetich, Roxan BROCKS, NP    I personally spent a total of *** minutes in the care of the  patient today including {Time Based Coding:210964241}.    Harlene Bogaert, AGNP-BC  [facility] [address] [place], [place] [address]  Phone [phone] Fax [phone] Note: This document was prepared with digital dictation and possible smart phrase technology. Any transcriptional errors that result from this process are unintentional.

## 2024-09-14 ENCOUNTER — Ambulatory Visit: Admitting: Adult Health

## 2024-09-14 ENCOUNTER — Encounter: Payer: Self-pay | Admitting: Adult Health

## 2024-09-14 VITALS — BP 126/79 | HR 89 | Ht 67.0 in | Wt 229.0 lb

## 2024-09-14 DIAGNOSIS — G629 Polyneuropathy, unspecified: Secondary | ICD-10-CM

## 2024-09-14 DIAGNOSIS — Z09 Encounter for follow-up examination after completed treatment for conditions other than malignant neoplasm: Secondary | ICD-10-CM | POA: Diagnosis not present

## 2024-09-14 DIAGNOSIS — R413 Other amnesia: Secondary | ICD-10-CM

## 2024-09-14 DIAGNOSIS — H3411 Central retinal artery occlusion, right eye: Secondary | ICD-10-CM | POA: Diagnosis not present

## 2024-09-14 MED ORDER — ATORVASTATIN CALCIUM 40 MG PO TABS: 40.0000 mg | ORAL_TABLET | Freq: Every day | ORAL | 1 refills | Status: AC

## 2024-09-14 NOTE — Progress Notes (Signed)
 I agree with the above plan

## 2024-09-14 NOTE — Patient Instructions (Signed)
 Referral will be placed to Dr. Onita for evaluation of neuropathy and possible causes. We will also check lab work today to rule out reversible causes  Memory testing completed today. We will check lab work to rule out reversible causes. Long term use of THC use likely contributing, please continue to refrain from use. This can continue to be monitored by your PCP at this time.   Continue clopidogrel  75 mg daily  and atorvastatin  for secondary stroke prevention Please stop aspirin  at this time  Continue to follow up with PCP regarding blood pressure and cholesterol management  Maintain strict control of hypertension with blood pressure goal below 130/90 and cholesterol with LDL cholesterol (bad cholesterol) goal below 70 mg/dL.   Signs of a Stroke? Follow the BEFAST method:  Balance Watch for a sudden loss of balance, trouble with coordination or vertigo Eyes Is there a sudden loss of vision in one or both eyes? Or double vision?  Face: Ask the person to smile. Does one side of the face droop or is it numb?  Arms: Ask the person to raise both arms. Does one arm drift downward? Is there weakness or numbness of a leg? Speech: Ask the person to repeat a simple phrase. Does the speech sound slurred/strange? Is the person confused ? Time: If you observe any of these signs, call 911.        Thank you for coming to see us  at [facility]. I hope we have been able to provide you high quality care today.  You may receive a patient satisfaction survey over the next few weeks. We would appreciate your feedback and comments so that we may continue to improve ourselves and the health of our patients.

## 2024-09-16 LAB — NEUROPATHY PANEL
A/G Ratio: 1.2 (ref 0.7–1.7)
Albumin ELP: 3.7 g/dL (ref 2.9–4.4)
Alpha 1: 0.3 g/dL (ref 0.0–0.4)
Alpha 2: 0.8 g/dL (ref 0.4–1.0)
Angio Convert Enzyme: 8 U/L — ABNORMAL LOW (ref 14–82)
Anti Nuclear Antibody (ANA): NEGATIVE
Beta: 1.1 g/dL (ref 0.7–1.3)
Gamma Globulin: 1 g/dL (ref 0.4–1.8)
Globulin, Total: 3.2 g/dL (ref 2.2–3.9)
Rheumatoid fact SerPl-aCnc: <10 [IU]/mL (ref <14.0)
Sed Rate: 23 mm/h (ref 0–30)
TSH: 3.73 u[IU]/mL (ref 0.450–4.500)
Total Protein: 6.9 g/dL (ref 6.0–8.5)
Vit D, 25-Hydroxy: 40.5 ng/mL (ref 30.0–100.0)
Vitamin B-12: 702 pg/mL (ref 232–1245)

## 2024-09-16 LAB — DEMENTIA PANEL
Homocysteine: 15.7 umol/L (ref 0.0–19.2)
RPR Ser Ql: NONREACTIVE

## 2024-09-19 ENCOUNTER — Ambulatory Visit: Payer: Self-pay | Admitting: Adult Health

## 2024-09-22 NOTE — Telephone Encounter (Signed)
 I contacted pt, went over NP recommendations. He verbally understood and was appreciative.  Call was also transferred to referrals to schedule NCS/EMG.

## 2024-09-23 ENCOUNTER — Institutional Professional Consult (permissible substitution): Admitting: Neurology

## 2024-09-23 VITALS — BP 114/72 | HR 87 | Ht 67.0 in | Wt 224.0 lb

## 2024-09-23 DIAGNOSIS — R202 Paresthesia of skin: Secondary | ICD-10-CM | POA: Insufficient documentation

## 2024-09-23 DIAGNOSIS — R269 Unspecified abnormalities of gait and mobility: Secondary | ICD-10-CM | POA: Diagnosis not present
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
## 2024-09-26 ENCOUNTER — Telehealth: Payer: Self-pay | Admitting: Neurology

## 2024-09-26 NOTE — Telephone Encounter (Signed)
 no auth required sent to [facility] [phone]

## 2024-10-07 ENCOUNTER — Telehealth: Payer: Self-pay

## 2024-10-07 DIAGNOSIS — Z8673 Personal history of transient ischemic attack (TIA), and cerebral infarction without residual deficits: Secondary | ICD-10-CM

## 2024-10-07 NOTE — Telephone Encounter (Signed)
 The patient has been notified of this information and all questions answered.

## 2024-10-07 NOTE — Telephone Encounter (Signed)
 Copied from CRM [id number]. Topic: Referral - Question >> Oct 05, 2024 12:03 PM Cherylann RAMAN wrote: Reason for CRM: Patient is requesting a referral to [facility] as he was supposed to have one that was sent over by the hospital last month when he suffered a stroke. Please contact patient at [phone] for additional information.   [facility] [phone]

## 2024-10-07 NOTE — Telephone Encounter (Signed)
Ophthalmology referral placed as requested

## 2024-10-14 ENCOUNTER — Ambulatory Visit
Admission: RE | Admit: 2024-10-14 | Discharge: 2024-10-14 | Disposition: A | Source: Ambulatory Visit | Attending: Neurology | Admitting: Neurology

## 2024-10-14 DIAGNOSIS — R269 Unspecified abnormalities of gait and mobility: Secondary | ICD-10-CM | POA: Diagnosis not present

## 2024-10-18 ENCOUNTER — Telehealth: Payer: Self-pay | Admitting: Neurology

## 2024-10-18 DIAGNOSIS — M4802 Spinal stenosis, cervical region: Secondary | ICD-10-CM | POA: Insufficient documentation

## 2024-10-18 DIAGNOSIS — R269 Unspecified abnormalities of gait and mobility: Secondary | ICD-10-CM

## 2024-10-18 NOTE — Addendum Note (Signed)
 Addended by: Fryda Molenda on: 10/18/2024 02:23 PM   Modules accepted: Orders

## 2024-10-18 NOTE — Telephone Encounter (Addendum)
 I called patient, only able to leave a message, please call him again   MRI of the cervical spine showed multilevel degenerative changes, with evidence of moderate canal stenosis at C3-4, C4-5 level, likely contributing to his complaints of unsteady gait  MRI of the lumbar spine also showed multilevel degenerative changes, most noticeable at L3-4 level  I would like to refer him to neurosurgeon for evaluation  If he has a lot of question about his MRI findings, it is okay to give him a virtual visit or in person visit,  Orders Placed This Encounter  Procedures   Ambulatory referral to Neurosurgery       IMPRESSION: 1. No acute intracranial abnormality.  IMPRESSION: This MRI of the cervical spine without contrast shows the following: The spinal cord appears normal. Multilevel degenerative changes as detailed above because moderate spinal stenosis at C3-C4 and C4-C5 with mild spinal stenosis at C2-C3 and C5-C6 There is moderately severe right foraminal narrowing at C3-C4, left foraminal narrowing at C4-C5 and right foraminal narrowing at C5-C6.  This leads to some potential for right C4, left C5 and right C6 nerve root compression. Incidental note of a reduced height pituitary gland with an enlarged sella turcica.  A partially empty sella turcica is usually an incidental finding but could also be due to elevated intracranial pressure.    IMPRESSION: This MRI of the lumbar spine without contrast shows the following: At L2-L3, degenerative changes cause mild spinal stenosis and moderate lateral recess stenosis bilaterally.  There does not appear to be nerve root compression. At L3-L4, degenerative changes cause moderate spinal stenosis and moderate bilateral lateral recess stenosis but no nerve root compression. At L4-L5, degenerative changes cause borderline spinal stenosis in the transverse diameter and mild to moderate lateral recess stenosis but no nerve root compression. At L5-S1,  degenerative changes cause mild spinal stenosis and mild to moderate foraminal narrowing but no nerve root compression.

## 2024-10-19 ENCOUNTER — Telehealth: Payer: Self-pay | Admitting: Neurology

## 2024-10-19 NOTE — Telephone Encounter (Signed)
 Patient informed with below, he would like to proceed with referral. I explained it has been placed and someone will be calling him to schedule.

## 2024-10-19 NOTE — Telephone Encounter (Signed)
Left for patient to call

## 2024-10-19 NOTE — Telephone Encounter (Signed)
 Pt is requesting a call back from [facility]

## 2024-10-19 NOTE — Telephone Encounter (Signed)
Referral for neurosurgery fax to [facility]. Phone: [phone], Fax: [phone]

## 2024-11-01 ENCOUNTER — Other Ambulatory Visit: Payer: Self-pay

## 2024-11-01 DIAGNOSIS — G609 Hereditary and idiopathic neuropathy, unspecified: Secondary | ICD-10-CM

## 2024-11-01 MED ORDER — GABAPENTIN 100 MG PO CAPS: 200.0000 mg | ORAL_CAPSULE | Freq: Two times a day (BID) | ORAL | 1 refills | Status: AC

## 2024-11-01 NOTE — Telephone Encounter (Signed)
 RX was last approved by ED doctor, will send to PCP to advise if we will fill going forward

## 2024-11-01 NOTE — Telephone Encounter (Signed)
 Copied from CRM [id number]. Topic: Clinical - Medication Refill >> Nov 01, 2024 11:39 AM Graeme ORN wrote: Medication: gabapentin  (NEURONTIN ) 100 MG capsule - request 90 day supply for this and all future medications  Has the patient contacted their pharmacy? Yes (Agent: If no, request that the patient contact the pharmacy for the refill. If patient does not wish to contact the pharmacy document the reason why and proceed with request.) (Agent: If yes, when and what did the pharmacy advise?) no record  This is the patient's preferred pharmacy:  [id number] - [place] ([place]), [place] - [address] [place] ([place]) [place] [address] Phone: [phone] Fax: [phone]  Is this the correct pharmacy for this prescription? Yes If no, delete pharmacy and type the correct one.   Has the prescription been filled recently? No - previous provider  Is the patient out of the medication? No - 8 left  Has the patient been seen for an appointment in the last year OR does the patient have an upcoming appointment? Yes  Can we respond through MyChart? Yes  Agent: Please be advised that Rx refills may take up to 3 business days. We ask that you follow-up with your pharmacy.

## 2024-11-03 ENCOUNTER — Telehealth: Payer: Self-pay | Admitting: Family

## 2024-11-03 DIAGNOSIS — I2510 Atherosclerotic heart disease of native coronary artery without angina pectoris: Secondary | ICD-10-CM

## 2024-11-03 MED ORDER — LISINOPRIL 10 MG PO TABS: 10.0000 mg | ORAL_TABLET | Freq: Every day | ORAL | 3 refills | Status: AC

## 2024-11-03 NOTE — Telephone Encounter (Signed)
 Copied from CRM #[id number]. Topic: Clinical - Medication Refill >> Nov 03, 2024 11:27 AM DeAngela L wrote: Medication: lisinopril  (ZESTRIL ) 10 MG tablet Please fill this for a 90 day supply   Has the patient contacted their pharmacy? Yes  (Agent: If no, request that the patient contact the pharmacy for the refill. If patient does not wish to contact the pharmacy document the reason why and proceed with request.) (Agent: If yes, when and what did the pharmacy advise?)  This is the patient's preferred pharmacy:  [facility] [address] ([address] [place] - [address] [place] ([place]) [place] [address] Phone: [phone] Fax: [phone]  Is this the correct pharmacy for this prescription? Yes  If no, delete pharmacy and type the correct one.   Has the prescription been filled recently? Not by Dinah previous provider used to fill this medication   Is the patient out of the medication? No   Has the patient been seen for an appointment in the last year OR does the patient have an upcoming appointment? Yes  Can we respond through MyChart? Yes   Agent: Please be advised that Rx refills may take up to 3 business days. We ask that you follow-up with your pharmacy.

## 2024-11-06 ENCOUNTER — Other Ambulatory Visit: Payer: Self-pay | Admitting: Family

## 2024-12-23 ENCOUNTER — Other Ambulatory Visit

## 2024-12-26 ENCOUNTER — Ambulatory Visit: Admitting: Family
